# Patient Record
Sex: Female | Born: 1955 | Race: White | Hispanic: No | Smoking: Current every day smoker
Health system: Southern US, Community
[De-identification: ages and names within clinical notes are randomized; demographics above are authoritative.]

## PROBLEM LIST (undated history)

## (undated) DIAGNOSIS — E119 Type 2 diabetes mellitus without complications: Secondary | ICD-10-CM

## (undated) DIAGNOSIS — F410 Panic disorder [episodic paroxysmal anxiety] without agoraphobia: Secondary | ICD-10-CM

## (undated) DIAGNOSIS — Z8619 Personal history of other infectious and parasitic diseases: Secondary | ICD-10-CM

## (undated) DIAGNOSIS — J45909 Unspecified asthma, uncomplicated: Secondary | ICD-10-CM

## (undated) DIAGNOSIS — K219 Gastro-esophageal reflux disease without esophagitis: Secondary | ICD-10-CM

## (undated) DIAGNOSIS — I10 Essential (primary) hypertension: Secondary | ICD-10-CM

## (undated) DIAGNOSIS — E785 Hyperlipidemia, unspecified: Secondary | ICD-10-CM

## (undated) HISTORY — DX: Gastro-esophageal reflux disease without esophagitis: K21.9

## (undated) HISTORY — DX: Unspecified asthma, uncomplicated: J45.909

## (undated) HISTORY — DX: Type 2 diabetes mellitus without complications: E11.9

## (undated) HISTORY — DX: Essential (primary) hypertension: I10

## (undated) HISTORY — DX: Panic disorder (episodic paroxysmal anxiety): F41.0

## (undated) HISTORY — PX: APPENDECTOMY: SHX54

## (undated) HISTORY — DX: Hyperlipidemia, unspecified: E78.5

## (undated) HISTORY — DX: Personal history of other infectious and parasitic diseases: Z86.19

---

## 1988-01-12 HISTORY — PX: CHOLECYSTECTOMY: SHX55

## 1988-01-12 HISTORY — PX: ABDOMINAL HYSTERECTOMY: SHX81

## 2004-02-16 ENCOUNTER — Emergency Department: Payer: Self-pay | Admitting: Emergency Medicine

## 2004-06-05 ENCOUNTER — Emergency Department: Payer: Self-pay | Admitting: Emergency Medicine

## 2005-01-29 ENCOUNTER — Emergency Department: Payer: Self-pay | Admitting: Internal Medicine

## 2005-03-10 ENCOUNTER — Emergency Department: Payer: Self-pay | Admitting: Emergency Medicine

## 2006-05-28 ENCOUNTER — Emergency Department: Payer: Self-pay | Admitting: Emergency Medicine

## 2006-10-21 ENCOUNTER — Emergency Department: Payer: Self-pay | Admitting: Emergency Medicine

## 2007-04-11 DIAGNOSIS — J309 Allergic rhinitis, unspecified: Secondary | ICD-10-CM | POA: Insufficient documentation

## 2008-01-12 HISTORY — PX: COLONOSCOPY: SHX174

## 2008-05-14 ENCOUNTER — Ambulatory Visit: Payer: Self-pay | Admitting: Family Medicine

## 2008-05-24 ENCOUNTER — Emergency Department: Payer: Self-pay | Admitting: Emergency Medicine

## 2008-11-28 ENCOUNTER — Ambulatory Visit: Payer: Self-pay | Admitting: Gastroenterology

## 2008-12-11 DIAGNOSIS — Z860100 Personal history of colon polyps, unspecified: Secondary | ICD-10-CM | POA: Insufficient documentation

## 2008-12-30 ENCOUNTER — Ambulatory Visit: Payer: Self-pay | Admitting: Gastroenterology

## 2009-01-02 ENCOUNTER — Ambulatory Visit: Payer: Self-pay | Admitting: Gastroenterology

## 2009-01-11 HISTORY — PX: OTHER SURGICAL HISTORY: SHX169

## 2009-01-15 DIAGNOSIS — K648 Other hemorrhoids: Secondary | ICD-10-CM | POA: Insufficient documentation

## 2009-03-11 HISTORY — PX: COLECTOMY: SHX59

## 2009-03-20 ENCOUNTER — Ambulatory Visit: Payer: Self-pay | Admitting: Surgery

## 2009-03-20 ENCOUNTER — Ambulatory Visit: Payer: Self-pay | Admitting: Cardiovascular Disease

## 2009-03-31 ENCOUNTER — Inpatient Hospital Stay: Payer: Self-pay | Admitting: Surgery

## 2009-09-09 ENCOUNTER — Ambulatory Visit: Payer: Self-pay | Admitting: Surgery

## 2009-10-07 ENCOUNTER — Ambulatory Visit: Payer: Self-pay | Admitting: Surgery

## 2009-11-25 ENCOUNTER — Ambulatory Visit: Payer: Self-pay | Admitting: Internal Medicine

## 2009-11-25 DIAGNOSIS — I10 Essential (primary) hypertension: Secondary | ICD-10-CM | POA: Insufficient documentation

## 2009-11-25 DIAGNOSIS — F419 Anxiety disorder, unspecified: Secondary | ICD-10-CM

## 2010-02-10 NOTE — Assessment & Plan Note (Signed)
Summary: COUGHING,MUCUS IS GREEN/EVM   Vital Signs:  Patient Profile:   55 Years Old Female CC:      Possible URI Height:     61 inches Weight:      222 pounds BMI:     42.10 O2 Sat:      96 % O2 treatment:    Room Air Temp:     97.5 degrees F tympanic Pulse rate:   89 / minute Pulse rhythm:   regular Resp:     20 per minute BP sitting:   133 / 86  (right arm)  Pt. in pain?   no  Vitals Entered By: Levonne Spiller EMT-P (November 25, 2009 1:50 PM)              Is Patient Diabetic? No Comments Pt is a smoker.1 pack per day. Is wearing a nicotine patch. / rwt      Current Allergies: ! * LATEXHistory of Present Illness Chief Complaint: Possible URI for 1 week History of Present Illness: slowly progressive purulent nasal discharge and sputum worse at night.  today developed "low grade fever" not measured. has had mild chills.  REVIEW OF SYSTEMS Constitutional Symptoms       Complains of fever and chills.     Denies night sweats, weight loss, weight gain, and fatigue.  Eyes       Denies change in vision, eye pain, eye discharge, glasses, contact lenses, and eye surgery. Ear/Nose/Throat/Mouth       Complains of sore throat and hoarseness.      Denies hearing loss/aids, change in hearing, ear pain, ear discharge, dizziness, frequent runny nose, frequent nose bleeds, sinus problems, and tooth pain or bleeding.  Respiratory       Complains of productive cough, wheezing, shortness of breath, and bronchitis.      Denies dry cough, asthma, and emphysema/COPD.      Comments: Colored Sputum. sob only after cough spell Cardiovascular       Denies murmurs, chest pain, and tires easily with exhertion.    Gastrointestinal       Denies stomach pain, nausea/vomiting, diarrhea, constipation, blood in bowel movements, and indigestion.      Comments: Gastrology Surgery 03/2009 Genitourniary       Denies painful urination, kidney stones, and loss of urinary control. Neurological  Denies paralysis, seizures, and fainting/blackouts. Musculoskeletal       Denies muscle pain, joint pain, joint stiffness, decreased range of motion, redness, swelling, muscle weakness, and gout.  Skin       Denies bruising, unusual mles/lumps or sores, and hair/skin or nail changes.  Psych       Denies mood changes, temper/anger issues, anxiety/stress, speech problems, depression, and sleep problems.  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 Status:  current Physical Exam General appearance: obese, well developed, well nourished, no acute distress Eyes: conjunctivae and lids normal Ears: normal, no lesions or deformities Nasal: swollen red turbinates with congestion Oral/Pharynx: tongue normal, posterior pharynx with cobbelstoning Neck: supple,anterior lymphadenopathy present Chest/Lungs: no rales, wheezes, or rhonchi bilateral, breath sounds equal without effort Extremities: normal extremities Neurological: grossly intact and non-focal MSE: oriented to time, place, and person Assessment New Problems: UPPER RESPIRATORY INFECTION, ACUTE (ICD-465.9) HYPERTENSION (ICD-401.9) ANXIETY (ICD-300.00)   Plan New Medications/Changes: AMOXICILLIN-POT CLAVULANATE 875-125 MG TABS (  AMOXICILLIN-POT CLAVULANATE) 1 by mouth two times a day pc  #20 x 0, 11/25/2009, J. Juline Patch MD   The patient and/or caregiver has been counseled thoroughly with regard to medications prescribed including dosage, schedule, interactions, rationale for use, and possible side effects and they verbalize understanding.  Diagnoses and expected course of recovery discussed and will return if not improved as expected or if the condition worsens. Patient and/or caregiver  verbalized understanding.  Prescriptions: AMOXICILLIN-POT CLAVULANATE 875-125 MG TABS (AMOXICILLIN-POT CLAVULANATE) 1 by mouth two times a day pc  #20 x 0   Entered and Authorized by:   J. Juline Patch MD   Signed by:   Shela Commons. Juline Patch MD on 11/25/2009   Method used:   Electronically to        [facility]  [address] (retail)       [address], [address]       [place], [place]  [address]       Ph: [phone]       Fax: [phone]   RxID:   [id number]   Patient Instructions: 1)  continue the nicotine patch. 2)  steam inhaled 3)  Get plenty of rest, drink lots of clear liquids, and use Tylenol or Ibuprofen for fever and comfort. Return in 7-10 days if you're not better:sooner if you're feeling worse. 4)  coricidin for hbp. 5)  stay out of work if fever >101

## 2010-03-18 ENCOUNTER — Encounter: Payer: Self-pay | Admitting: Internal Medicine

## 2010-03-18 ENCOUNTER — Ambulatory Visit: Payer: BC Managed Care – PPO | Admitting: Internal Medicine

## 2010-03-18 DIAGNOSIS — K432 Incisional hernia without obstruction or gangrene: Secondary | ICD-10-CM

## 2010-03-24 NOTE — Assessment & Plan Note (Signed)
Summary: STOMACH PAINS   Vital Signs:  Patient Profile:   55 Years Old Female CC:      abdominal hernia Pain x 10 mos  Pt. in pain?   yes    Location:   abdomen    Intensity:   8    Type:       dull                   Current Allergies: ! * LATEXHistory of Present Illness Chief Complaint: abdominal hernia Pain x 10 mos History of Present Illness: Abd surg 3/11. First had pain and swelling in incision area 2 mos later. Has been told to monitor it. Pain and swelling come and goes and is worse with lifting. For the last few days it has been worse and more ofter. Some times 10/10 and presently 8/10. It gets better to lay down and rest. Some occas nausea but none now. BM's unchanged.  REVIEW OF SYSTEMS Constitutional Symptoms      Denies fever, chills, night sweats, weight loss, weight gain, and fatigue.  Eyes       Denies change in vision, eye pain, eye discharge, glasses, contact lenses, and eye surgery. Ear/Nose/Throat/Mouth       Denies hearing loss/aids, change in hearing, ear pain, ear discharge, dizziness, frequent runny nose, frequent nose bleeds, sinus problems, sore throat, hoarseness, and tooth pain or bleeding.  Respiratory       Denies dry cough, productive cough, wheezing, shortness of breath, asthma, bronchitis, and emphysema/COPD.  Cardiovascular       Denies murmurs, chest pain, and tires easily with exhertion.    Gastrointestinal       Complains of stomach pain.      Denies nausea/vomiting, diarrhea, constipation, blood in bowel movements, and indigestion. Genitourniary       Denies painful urination, kidney stones, and loss of urinary control. Neurological       Denies paralysis, seizures, and fainting/blackouts. Musculoskeletal       Denies muscle pain, joint pain, joint stiffness, decreased range of motion, redness, swelling, muscle weakness, and gout.  Skin       Denies bruising, unusual mles/lumps or sores, and hair/skin or nail changes.  Psych  Denies mood changes, temper/anger issues, anxiety/stress, speech problems, depression, and sleep problems.  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 Swelling and tenderness in RLQ from umbilical surg scar and extending approx15cm. Any trapped intestine was soft and easily reduced and there was much less tenderness when supine Neurological: grossly intact and non-focal Skin: no obvious rashe MSE: oriented to time, place, and person Assessment New Problems: INCI HERNIA WITHOUT MENTION OBSTRUCTION/GANGRENE (ZOX-096.04)   The patient and/or caregiver has been counseled thoroughly with regard to medications prescribed including dosage, schedule, interactions, rationale for use, and possible side effects and they verbalize understanding.  Diagnoses and expected course of recovery discussed and will return if not improved as expected or if the condition worsens. Patient and/or caregiver verbalized understanding.    Patient Instructions:  1)  careful lifting trying to inhale during the lift. 2)  Please schedule a follow-up appointment as needed. Dr. Colette Ribas, general surg, for repair when convenient. 3)  Take 650-1000mg  of Tylenol every 4-6 hours as needed for relief of pain or comfort of fever AVOID taking more than 4000mg   in a 24 hour period (can cause liver damage in higher  doses).  Appended Document: STOMACH PAINS blood pressure 140/76 temp 98.2

## 2010-03-30 ENCOUNTER — Encounter: Payer: Self-pay | Admitting: Internal Medicine

## 2010-04-09 NOTE — Miscellaneous (Signed)
Summary: history form   history form   Imported By: Eugenio Hoes 03/30/2010 13:45:47  _____________________________________________________________________  External Attachment:    Type:   Image     Comment:   External Document

## 2010-04-09 NOTE — Letter (Signed)
Summary: histoy form   histoy form   Imported By: Eugenio Hoes 03/31/2010 15:33:24  _____________________________________________________________________  External Attachment:    Type:   Image     Comment:   External Document

## 2010-04-10 ENCOUNTER — Ambulatory Visit: Payer: Self-pay | Admitting: Surgery

## 2010-04-11 LAB — CA 125: CA 125: 13.4 U/mL (ref 0.0–34.0)

## 2010-04-22 ENCOUNTER — Ambulatory Visit: Payer: Self-pay | Admitting: Surgery

## 2010-08-06 ENCOUNTER — Emergency Department: Payer: Self-pay | Admitting: Unknown Physician Specialty

## 2010-09-10 ENCOUNTER — Ambulatory Visit: Payer: Self-pay | Admitting: Family Medicine

## 2010-09-12 ENCOUNTER — Ambulatory Visit: Payer: Self-pay | Admitting: Family Medicine

## 2010-09-12 HISTORY — PX: HERNIA REPAIR: SHX51

## 2014-05-20 LAB — HEMOGLOBIN A1C: HEMOGLOBIN A1C: 7.5 % — AB (ref 4.0–6.0)

## 2014-09-23 ENCOUNTER — Encounter: Payer: Self-pay | Admitting: Family Medicine

## 2014-09-23 ENCOUNTER — Ambulatory Visit (INDEPENDENT_AMBULATORY_CARE_PROVIDER_SITE_OTHER): Payer: Self-pay | Admitting: Family Medicine

## 2014-09-23 VITALS — BP 120/68 | HR 81 | Temp 98.6°F | Resp 16 | Wt 226.0 lb

## 2014-09-23 DIAGNOSIS — Z8541 Personal history of malignant neoplasm of cervix uteri: Secondary | ICD-10-CM | POA: Insufficient documentation

## 2014-09-23 DIAGNOSIS — M25561 Pain in right knee: Secondary | ICD-10-CM | POA: Insufficient documentation

## 2014-09-23 DIAGNOSIS — E1121 Type 2 diabetes mellitus with diabetic nephropathy: Secondary | ICD-10-CM | POA: Insufficient documentation

## 2014-09-23 DIAGNOSIS — R32 Unspecified urinary incontinence: Secondary | ICD-10-CM | POA: Insufficient documentation

## 2014-09-23 DIAGNOSIS — R7303 Prediabetes: Secondary | ICD-10-CM | POA: Insufficient documentation

## 2014-09-23 DIAGNOSIS — M778 Other enthesopathies, not elsewhere classified: Secondary | ICD-10-CM

## 2014-09-23 DIAGNOSIS — K219 Gastro-esophageal reflux disease without esophagitis: Secondary | ICD-10-CM | POA: Insufficient documentation

## 2014-09-23 DIAGNOSIS — R319 Hematuria, unspecified: Secondary | ICD-10-CM | POA: Insufficient documentation

## 2014-09-23 DIAGNOSIS — G44209 Tension-type headache, unspecified, not intractable: Secondary | ICD-10-CM | POA: Insufficient documentation

## 2014-09-23 DIAGNOSIS — K432 Incisional hernia without obstruction or gangrene: Secondary | ICD-10-CM | POA: Insufficient documentation

## 2014-09-23 DIAGNOSIS — Z8711 Personal history of peptic ulcer disease: Secondary | ICD-10-CM | POA: Insufficient documentation

## 2014-09-23 DIAGNOSIS — M779 Enthesopathy, unspecified: Secondary | ICD-10-CM

## 2014-09-23 DIAGNOSIS — G4700 Insomnia, unspecified: Secondary | ICD-10-CM

## 2014-09-23 DIAGNOSIS — Z8719 Personal history of other diseases of the digestive system: Secondary | ICD-10-CM | POA: Insufficient documentation

## 2014-09-23 DIAGNOSIS — N951 Menopausal and female climacteric states: Secondary | ICD-10-CM | POA: Insufficient documentation

## 2014-09-23 DIAGNOSIS — F419 Anxiety disorder, unspecified: Secondary | ICD-10-CM

## 2014-09-23 DIAGNOSIS — R809 Proteinuria, unspecified: Secondary | ICD-10-CM | POA: Insufficient documentation

## 2014-09-23 DIAGNOSIS — M6588 Other synovitis and tenosynovitis, other site: Secondary | ICD-10-CM

## 2014-09-23 MED ORDER — NAPROXEN 500 MG PO TABS: 500.0000 mg | ORAL_TABLET | Freq: Two times a day (BID) | ORAL | Status: DC

## 2014-09-23 MED ORDER — ALPRAZOLAM 1 MG PO TABS: ORAL_TABLET | ORAL | Status: DC

## 2014-09-23 NOTE — Progress Notes (Signed)
Patient: Tammy Holt Female    DOB: 09-15-1955   59 y.o.   MRN: [id number] Visit Date: 09/23/2014  Today's Provider: Mila Merry, MD   Chief Complaint  Patient presents with  . Hand Pain    Right thumb   Subjective:    Hand Pain  The incident occurred more than 1 week ago (2 weeks ago). The injury mechanism is unknown. The pain is present in the right fingers (right thumb). The quality of the pain is described as aching. The pain radiates to the right hand. The pain is at a severity of 8/10. The pain is moderate. The pain has been constant since the incident. Pertinent negatives include no chest pain, muscle weakness, numbness or tingling. The symptoms are aggravated by palpation, lifting and movement. She has tried NSAIDs for the symptoms. The treatment provided no relief.   The patient said that she just lost her boyfriend of 24 years 2 months ago. Patient said that she is having a hard time dealing with grief. Patient said that she has been having trouble sleeping. She feels really anxious and has trouble composing herself during the day. She is still taking paroxetine every day.    Allergies  Allergen Reactions  . Latex     REACTION: Hives, Blisters   Previous Medications   ALBUTEROL (PROAIR HFA) 108 (90 BASE) MCG/ACT INHALER    Inhale 1-2 puffs into the lungs 4 (four) times daily as needed. For wheezing   BUDESONIDE-FORMOTEROL (SYMBICORT) 80-4.5 MCG/ACT INHALER    Inhale 2 puffs into the lungs 2 (two) times daily as needed.   LISINOPRIL (PRINIVIL,ZESTRIL) 20 MG TABLET    Take 1 tablet by mouth daily.   METFORMIN (GLUCOPHAGE) 850 MG TABLET    Take 1 tablet by mouth daily.   PAROXETINE (PAXIL) 40 MG TABLET    Take 1 tablet by mouth daily.    Review of Systems  Constitutional: Positive for fatigue.  Cardiovascular: Negative for chest pain and palpitations.  Neurological: Negative for tingling and numbness.  Psychiatric/Behavioral: Positive for sleep disturbance and  agitation.    Social History  Substance Use Topics  . Smoking status: Current Every Day Smoker -- 0.50 packs/day for 25 years    Types: Cigarettes  . Smokeless tobacco: Not on file  . Alcohol Use: No   Objective:   BP 120/68 mmHg  Pulse 81  Temp(Src) 98.6 F (37 C) (Oral)  Resp 16  Wt 226 lb (102.513 kg)  SpO2 96%  Physical Exam  General appearance: alert, well developed, well nourished, cooperative and in no distress Head: Normocephalic, without obvious abnormality, atraumatic Lungs: Respirations even and unlabored Extremities: No gross deformities. Left thumb tender dorsal aspect, minimal swelling. Catches when she tried to extend thumb.  Skin: Skin color, texture, turgor normal. No rashes seen  Psych: Appropriate mood and affect. Tearful at times.  Neurologic: Mental status: Alert, oriented to person, place, and time, thought content appropriate.     Assessment & Plan:     1. Insomnia  - ALPRAZolam (XANAX) 1 MG tablet; 1/2 to 1 tablet three times a day as needed  Dispense: 30 tablet; Refill: 3  2. Acute anxiety Continue paroxetine. Consider adding bupropion for depression is not improving over the next few weeks.  - ALPRAZolam (XANAX) 1 MG tablet; 1/2 to 1 tablet three times a day as needed  Dispense: 30 tablet; Refill: 3  3. Thumb tendonitis Advised she should see hand specialist if not  improving on naproxen.  - naproxen (NAPROSYN) 500 MG tablet; Take 1 tablet (500 mg total) by mouth 2 (two) times daily with a meal.  Dispense: 30 tablet; Refill: 1       Mila Merry, MD  [facility]

## 2014-10-10 ENCOUNTER — Other Ambulatory Visit: Payer: Self-pay | Admitting: Family Medicine

## 2014-11-25 ENCOUNTER — Ambulatory Visit (INDEPENDENT_AMBULATORY_CARE_PROVIDER_SITE_OTHER): Payer: Self-pay | Admitting: Family Medicine

## 2014-11-25 ENCOUNTER — Encounter: Payer: Self-pay | Admitting: Family Medicine

## 2014-11-25 VITALS — BP 140/72 | HR 74 | Temp 98.5°F | Resp 18 | Wt 223.0 lb

## 2014-11-25 DIAGNOSIS — R509 Fever, unspecified: Secondary | ICD-10-CM

## 2014-11-25 DIAGNOSIS — R059 Cough, unspecified: Secondary | ICD-10-CM

## 2014-11-25 DIAGNOSIS — R05 Cough: Secondary | ICD-10-CM

## 2014-11-25 LAB — POC INFLUENZA A&B (BINAX/QUICKVUE)
INFLUENZA A, POC: NEGATIVE
INFLUENZA B, POC: NEGATIVE

## 2014-11-25 MED ORDER — HYDROCODONE-HOMATROPINE 5-1.5 MG/5ML PO SYRP: 5.0000 mL | ORAL_SOLUTION | Freq: Three times a day (TID) | ORAL | Status: DC | PRN

## 2014-11-25 MED ORDER — AMOXICILLIN 875 MG PO TABS: 875.0000 mg | ORAL_TABLET | Freq: Two times a day (BID) | ORAL | Status: DC

## 2014-11-25 NOTE — Progress Notes (Signed)
Patient ID: Tammy Holt, female   DOB: 1955-06-21, 59 y.o.   MRN: [id number] Name: Tammy Holt   MRN: [id number]    DOB: January 27, 1955   Date:11/25/2014       Progress Note  Subjective  Chief Complaint  Patient presents with  . Cough  . Nasal Congestion   Cough This is a new problem. The current episode started yesterday. The problem has been gradually worsening. The problem occurs constantly (worse at night). The cough is productive of sputum. Associated symptoms include ear congestion, a fever, nasal congestion, postnasal drip, rhinorrhea and a sore throat. Pertinent negatives include no ear pain. Associated symptoms comments: Temperature up to 100 F. Some sneezing with clear rhinorrhea.. The symptoms are aggravated by pollens (occasional ). She has tried OTC cough suppressant and a beta-agonist inhaler for the symptoms. The treatment provided mild relief.   Past Surgical History  Procedure Laterality Date  . Hernia repair  09/2010    Incisional hernia repair  . Abdominal hysterectomy  1990    Vaginal. Partial. Due to cervical cancer. One ovary resected + right cyst  . Cholecystectomy  1990  . Colectomy  03/2009    partial right colectomy for multiple polyps- all beninign (incidental appendectomy)  . Appendectomy    . Abdominal ultrasound  2011    large incisional hernia and ovarian cyst  . Colonoscopy  2010    multiple colon polyps with single large polyp, internal hemorrhoids. Dr. Niel Hummer. Referral to Genernal surgery for resection   Family History  Problem Relation Age of Onset  . Diabetes Mother   . Stroke Mother   . Depression Mother   . Alcohol abuse Father   . Cirrhosis Father     of the liver  . Colon cancer Sister   . Anxiety disorder Sister   . Lupus Sister   . COPD Sister    Past Medical History  Diagnosis Date  . GERD (gastroesophageal reflux disease)   . Diabetes mellitus without complication (HCC)   . Panic disorder   . Hypertension   . Hyperlipidemia    . Asthma   . History of chicken pox    Social History  Substance Use Topics  . Smoking status: Current Every Day Smoker -- 0.50 packs/day for 25 years    Types: Cigarettes  . Smokeless tobacco: Not on file  . Alcohol Use: No    Current outpatient prescriptions:  .  albuterol (PROAIR HFA) 108 (90 BASE) MCG/ACT inhaler, Inhale 1-2 puffs into the lungs 4 (four) times daily as needed. For wheezing, Disp: , Rfl:  .  ALPRAZolam (XANAX) 1 MG tablet, 1/2 to 1 tablet three times a day as needed, Disp: 30 tablet, Rfl: 3 .  budesonide-formoterol (SYMBICORT) 80-4.5 MCG/ACT inhaler, Inhale 2 puffs into the lungs 2 (two) times daily as needed., Disp: , Rfl:  .  lisinopril (PRINIVIL,ZESTRIL) 20 MG tablet, Take 1 tablet by mouth daily., Disp: , Rfl:  .  metFORMIN (GLUCOPHAGE) 850 MG tablet, TAKE ONE TABLET BY MOUTH ONCE DAILY, Disp: 30 tablet, Rfl: 12 .  naproxen (NAPROSYN) 500 MG tablet, Take 1 tablet (500 mg total) by mouth 2 (two) times daily with a meal., Disp: 30 tablet, Rfl: 1 .  PARoxetine (PAXIL) 40 MG tablet, Take 1 tablet by mouth daily., Disp: , Rfl:   Allergies  Allergen Reactions  . Latex     REACTION: Hives, Blisters   Review of Systems  Constitutional: Positive for fever.  HENT: Positive for congestion,  postnasal drip, rhinorrhea and sore throat. Negative for ear pain.   Eyes: Negative.   Respiratory: Positive for cough and sputum production.   Cardiovascular: Negative.   Gastrointestinal: Negative.   Genitourinary: Negative.   Musculoskeletal: Negative.   Skin: Negative.   Neurological: Negative.   Endo/Heme/Allergies: Negative.   Psychiatric/Behavioral: Negative.    Objective  Filed Vitals:   11/25/14 1429  BP: 140/72  Pulse: 74  Temp: 98.5 F (36.9 C)  TempSrc: Oral  Resp: 18  Weight: 223 lb (101.152 kg)  SpO2: 96%   Physical Exam  Constitutional: She is oriented to person, place, and time and well-developed, well-nourished, and in no distress.  HENT:  Head:  Normocephalic and atraumatic.  Left Ear: External ear normal.  Good transillumination throughout sinuses. Slightly cobblestoning of posterior pharynx.  Eyes: Conjunctivae and EOM are normal.  Neck: Normal range of motion. Neck supple.  Pulmonary/Chest: Effort normal. She has no wheezes. She has no rales.  Coarse breath sounds.   Abdominal: Soft. Bowel sounds are normal.  Neurological: She is alert and oriented to person, place, and time.  Skin: No rash noted.  Psychiatric: Affect and judgment normal.    Assessment & Plan  1. Fever, unspecified fever cause Onset yesterday with cough and congestion. History of smoking for 26 years. Flu test negative for A&B influenza. Increase fluid intake and check CBS with diff. Given antibiotic with cough medication. Recheck prn pending lab reports. - POC Influenza A&B (Binax test) - CBC with Differential/Platelet - amoxicillin (AMOXIL) 875 MG tablet; Take 1 tablet (875 mg total) by mouth 2 (two) times daily.  Dispense: 20 tablet; Refill: 0  2. Cough Very ticklish and some yellowish sputum. Will give cough suppressant and may add expectorant with Tylenol or Advil. Recheck prn. - POC Influenza A&B (Binax test) - HYDROcodone-homatropine (HYCODAN) 5-1.5 MG/5ML syrup; Take 5 mLs by mouth every 8 (eight) hours as needed for cough.  Dispense: 120 mL; Refill: 0

## 2014-11-26 ENCOUNTER — Telehealth: Payer: Self-pay

## 2014-11-26 LAB — CBC WITH DIFFERENTIAL/PLATELET
BASOS ABS: 0 10*3/uL (ref 0.0–0.2)
Basos: 0 %
EOS (ABSOLUTE): 0.1 10*3/uL (ref 0.0–0.4)
Eos: 1 %
HEMOGLOBIN: 15.2 g/dL (ref 11.1–15.9)
Hematocrit: 43.4 % (ref 34.0–46.6)
Immature Grans (Abs): 0 10*3/uL (ref 0.0–0.1)
Immature Granulocytes: 0 %
LYMPHS ABS: 3.7 10*3/uL — AB (ref 0.7–3.1)
Lymphs: 26 %
MCH: 31.3 pg (ref 26.6–33.0)
MCHC: 35 g/dL (ref 31.5–35.7)
MCV: 89 fL (ref 79–97)
MONOCYTES: 8 %
MONOS ABS: 1.1 10*3/uL — AB (ref 0.1–0.9)
NEUTROS ABS: 9.1 10*3/uL — AB (ref 1.4–7.0)
Neutrophils: 65 %
Platelets: 348 10*3/uL (ref 150–379)
RBC: 4.86 x10E6/uL (ref 3.77–5.28)
RDW: 13.2 % (ref 12.3–15.4)
WBC: 14.1 10*3/uL — AB (ref 3.4–10.8)

## 2014-11-26 NOTE — Telephone Encounter (Signed)
LMTCB

## 2014-11-26 NOTE — Telephone Encounter (Signed)
Pt is returning call.  UJ#[phone]/FACB#[phone]/MW

## 2014-11-26 NOTE — Telephone Encounter (Signed)
-----   Message from Tamsen Roersennis E Chrismon, [place] sent at 11/26/2014  8:19 AM EST ----- Elevation of WBC and high neutrophils indicates some infection present. Proceed with antibiotic. Should consider chest x-ray if no better in 3-4 days of the antibiotic. Recheck blood level and cough in 2 weeks.

## 2014-11-27 NOTE — Telephone Encounter (Signed)
Patient advised as directed below. Patient verbalized understanding and agrees with plan of care. Patient states she will call back to schedule a follow up appointment.  

## 2014-12-10 ENCOUNTER — Ambulatory Visit: Payer: Self-pay | Admitting: Family Medicine

## 2014-12-13 ENCOUNTER — Encounter: Payer: Self-pay | Admitting: Family Medicine

## 2014-12-13 ENCOUNTER — Ambulatory Visit (INDEPENDENT_AMBULATORY_CARE_PROVIDER_SITE_OTHER): Payer: Self-pay | Admitting: Family Medicine

## 2014-12-13 VITALS — BP 144/82 | HR 88 | Temp 98.2°F | Resp 18 | Wt 221.4 lb

## 2014-12-13 DIAGNOSIS — J40 Bronchitis, not specified as acute or chronic: Secondary | ICD-10-CM

## 2014-12-13 DIAGNOSIS — I10 Essential (primary) hypertension: Secondary | ICD-10-CM

## 2014-12-13 DIAGNOSIS — D72829 Elevated white blood cell count, unspecified: Secondary | ICD-10-CM

## 2014-12-13 NOTE — Progress Notes (Signed)
Patient ID: Tammy Holt, female   DOB: 1955-12-25, 59 y.o.   MRN: [id number]    Chief Complaint  Patient presents with  . Abnormal Lab  . Follow-up    Subjective:  HPI This 59 year old female had elevation of WBC count to 14,100 on 11-25-14 with fever and cough. Was treated with antibiotic and cough syrup. No return of fever and congestion has cleared.  Prior to Admission medications   Medication Sig Start Date End Date Taking? Authorizing Provider  albuterol (PROAIR HFA) 108 (90 BASE) MCG/ACT inhaler Inhale 1-2 puffs into the lungs 4 (four) times daily as needed. For wheezing 01/18/11  Yes Historical Provider, MD  ALPRAZolam Prudy Feeler(XANAX) 1 MG tablet 1/2 to 1 tablet three times a day as needed 09/23/14  Yes Malva Limesonald E Fisher, MD  budesonide-formoterol [facility](SYMBICORT) 80-4.5 MCG/ACT inhaler Inhale 2 puffs into the lungs 2 (two) times daily as needed. 09/24/13  Yes Historical Provider, MD  lisinopril (PRINIVIL,ZESTRIL) 20 MG tablet Take 1 tablet by mouth daily. 05/20/14  Yes Historical Provider, MD  metFORMIN (GLUCOPHAGE) 850 MG tablet TAKE ONE TABLET BY MOUTH ONCE DAILY 10/10/14  Yes Malva Limesonald E Fisher, MD  naproxen (NAPROSYN) 500 MG tablet Take 1 tablet (500 mg total) by mouth 2 (two) times daily with a meal. 09/23/14  Yes Malva Limesonald E Fisher, MD  PARoxetine (PAXIL) 40 MG tablet Take 1 tablet by mouth daily. 05/20/14  Yes Historical Provider, MD  HYDROcodone-homatropine (HYCODAN) 5-1.5 MG/5ML syrup Take 5 mLs by mouth every 8 (eight) hours as needed for cough. Patient not taking: Reported on 12/13/2014 11/25/14   Jodell Ciproennis E Chrismon, PA    Patient Active Problem List   Diagnosis Date Noted  . 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 GERD (gastroesophageal reflux disease) 09/23/2014  . Hematuria 09/23/2014  . Incisional hernia 09/23/2014  . History of cervical cancer 09/23/2014  . History of gastric ulcer 09/23/2014  . Menopausal symptoms 09/23/2014  . Proteinuria 09/23/2014  . Right knee pain  09/23/2014  . Tension type headache 09/23/2014  . Urinary incontinence 09/23/2014  . Insomnia 09/23/2014  . INCI HERNIA WITHOUT MENTION OBSTRUCTION/GANGRENE 03/18/2010  . Acute anxiety 11/25/2009  . HYPERTENSION 11/25/2009  . Cyst of ovary 02/09/2009  . Internal hemorrhoids without complication 01/15/2009  . History of colon polyps 12/11/2008  . External hemorrhoids without complication 10/29/2008  . Asthma without status asthmaticus 05/14/2008  . Allergic rhinitis 04/11/2007  . Current tobacco use 04/11/2007  . Panic disorder 04/11/2007  . Pure hypercholesterolemia 04/11/2007  . Overflow incontinence 04/11/2007  . Benign essential HTN 04/11/2007    Past Medical History  Diagnosis Date  . GERD (gastroesophageal reflux disease)   . Diabetes mellitus without complication (HCC)   . Panic disorder   . Hypertension   . Hyperlipidemia   . Asthma   . History of chicken pox     Social History   Social History  . Marital Status: Divorced    Spouse Name: N/A  . Number of Children: 3  . Years of Education: N/A   Occupational History  . Unemployed     looking for work. Previously employed at [employer] in [employer] for 10 years. Was fired due to having surgery and patient no longer able to do assigned tasks   Social History Main Topics  . Smoking status: Current Every Day Smoker -- 0.50 packs/day for 25 years    Types: Cigarettes  . Smokeless tobacco: Not on file  . Alcohol Use: No  .  Drug Use: No  . Sexual Activity: Not on file   Other Topics Concern  . Not on file   Social History Narrative    Allergies  Allergen Reactions  . Latex     REACTION: Hives, Blisters    Review of Systems  Constitutional: Negative.   HENT: Negative.   Eyes: Negative.   Respiratory: Positive for cough.   Cardiovascular: Negative.   Gastrointestinal: Negative.   Genitourinary: Negative.   Musculoskeletal: Negative.   Skin: Negative.   Neurological: Negative.     Endo/Heme/Allergies: Negative.   Psychiatric/Behavioral: Negative.     Immunization History  Administered Date(s) Administered  . Pneumococcal Polysaccharide-23 05/19/2011  . Tdap 05/19/2011   Objective:  BP 144/82 mmHg  Pulse 88  Temp(Src) 98.2 F (36.8 C) (Oral)  Resp 18  Wt 221 lb 6.4 oz (100.426 kg)  SpO2 96%  Physical Exam  Constitutional: She is oriented to person, place, and time and well-developed, well-nourished, and in no distress.  HENT:  Head: Normocephalic and atraumatic.  Right Ear: External ear normal.  Left Ear: External ear normal.  Nose: Nose normal.  Mouth/Throat: Oropharynx is clear and moist.  Eyes: Conjunctivae and EOM are normal.  Neck: Normal range of motion. Neck supple.  Cardiovascular: Normal rate, regular rhythm and normal heart sounds.   Pulmonary/Chest: Effort normal and breath sounds normal.  Abdominal: Soft. Bowel sounds are normal.  Lymphadenopathy:    She has no cervical adenopathy.  Neurological: She is alert and oriented to person, place, and time.    Lab Results  Component Value Date   WBC 14.1* 11/25/2014   HCT 43.4 11/25/2014   HGBA1C 7.5* 05/20/2014     Assessment and Plan :  1. Bronchitis Chest clear and no further fever. Still smoking up to 1 ppd. States she wants to work on quitting again after the Christmas Holiday. Still using Symbicort and occasionally some of the left over Hycodan for any cough. Recheck prn.  2. Leukocytosis WBC count was elevated with fever and cough 2 weeks ago. Will recheck CBC to be sure WBC count went back to normal. Recheck pending reports. Encouraged to continue routine follow up with Dr. Sherrie Mustache regarding diabetes follow up. - CBC with Differential/Platelet   Dortha Kern PA [facility] 12/13/2014 9:25 AM

## 2014-12-14 LAB — CBC WITH DIFFERENTIAL/PLATELET
Basophils Absolute: 0 10*3/uL (ref 0.0–0.2)
Basos: 0 %
EOS (ABSOLUTE): 0.1 10*3/uL (ref 0.0–0.4)
EOS: 1 %
Hematocrit: 43.9 % (ref 34.0–46.6)
Hemoglobin: 15.3 g/dL (ref 11.1–15.9)
Immature Grans (Abs): 0 10*3/uL (ref 0.0–0.1)
Immature Granulocytes: 0 %
Lymphocytes Absolute: 4.4 10*3/uL — ABNORMAL HIGH (ref 0.7–3.1)
Lymphs: 34 %
MCH: 31.2 pg (ref 26.6–33.0)
MCHC: 34.9 g/dL (ref 31.5–35.7)
MCV: 89 fL (ref 79–97)
MONOS ABS: 0.8 10*3/uL (ref 0.1–0.9)
Monocytes: 6 %
Neutrophils Absolute: 7.7 10*3/uL — ABNORMAL HIGH (ref 1.4–7.0)
Neutrophils: 59 %
PLATELETS: 386 10*3/uL — AB (ref 150–379)
RBC: 4.91 x10E6/uL (ref 3.77–5.28)
RDW: 12.9 % (ref 12.3–15.4)
WBC: 13 10*3/uL — ABNORMAL HIGH (ref 3.4–10.8)

## 2014-12-16 ENCOUNTER — Telehealth: Payer: Self-pay | Admitting: Family Medicine

## 2014-12-16 ENCOUNTER — Telehealth: Payer: Self-pay

## 2014-12-16 NOTE — Telephone Encounter (Signed)
Pt stated that she was returning a call about her results. Pt stated she didn't get the message that was left. Thanks TNP

## 2014-12-16 NOTE — Telephone Encounter (Signed)
-----   Message from Kohl'sDennis E Chrismon, [place]PA sent at 12/16/2014  8:20 AM EST ----- WBC count improved. Recheck if any problems with congestion in the near future.

## 2014-12-16 NOTE — Telephone Encounter (Signed)
Left patient a voicemail advising her of the message below. (Consent in chart)

## 2014-12-17 NOTE — Telephone Encounter (Signed)
Advised patient of lab results in previous message on 12/16/2014.

## 2015-01-25 ENCOUNTER — Other Ambulatory Visit: Payer: Self-pay | Admitting: Family Medicine

## 2015-01-27 ENCOUNTER — Other Ambulatory Visit: Payer: Self-pay | Admitting: Family Medicine

## 2015-01-27 MED ORDER — PAROXETINE HCL 40 MG PO TABS: 40.0000 mg | ORAL_TABLET | Freq: Every day | ORAL | Status: DC

## 2015-01-27 NOTE — Telephone Encounter (Signed)
Pt needs refill  lisinopril (PRINIVIL,ZESTRIL) 20 MG tablet 01/26/15 -- Malva Limesonald E Fisher, MD TAKE ONE TABLET BY MOUTH ONCE DAILY PARoxetine (PAXIL) 40 MG tablet   [facility] [address]  [place]

## 2015-01-28 NOTE — Telephone Encounter (Signed)
Left detailed message on pt's vm notifying her that she is due for f/u appt.

## 2015-03-03 ENCOUNTER — Ambulatory Visit (INDEPENDENT_AMBULATORY_CARE_PROVIDER_SITE_OTHER): Payer: Self-pay | Admitting: Family Medicine

## 2015-03-03 ENCOUNTER — Encounter: Payer: Self-pay | Admitting: Family Medicine

## 2015-03-03 VITALS — BP 126/64 | HR 71 | Temp 97.3°F | Resp 16 | Wt 216.0 lb

## 2015-03-03 DIAGNOSIS — G4700 Insomnia, unspecified: Secondary | ICD-10-CM

## 2015-03-03 DIAGNOSIS — E1140 Type 2 diabetes mellitus with diabetic neuropathy, unspecified: Secondary | ICD-10-CM

## 2015-03-03 DIAGNOSIS — I10 Essential (primary) hypertension: Secondary | ICD-10-CM

## 2015-03-03 DIAGNOSIS — Z720 Tobacco use: Secondary | ICD-10-CM

## 2015-03-03 DIAGNOSIS — F419 Anxiety disorder, unspecified: Secondary | ICD-10-CM

## 2015-03-03 LAB — POCT GLYCOSYLATED HEMOGLOBIN (HGB A1C)
ESTIMATED AVERAGE GLUCOSE: 126
Hemoglobin A1C: 6

## 2015-03-03 MED ORDER — LISINOPRIL 20 MG PO TABS: 20.0000 mg | ORAL_TABLET | Freq: Every day | ORAL | Status: DC

## 2015-03-03 MED ORDER — PAROXETINE HCL 40 MG PO TABS: 40.0000 mg | ORAL_TABLET | Freq: Every day | ORAL | Status: DC

## 2015-03-03 MED ORDER — ALPRAZOLAM 1 MG PO TABS: ORAL_TABLET | ORAL | Status: DC

## 2015-03-03 NOTE — Progress Notes (Signed)
Patient: Tammy Holt Female    DOB: 05/28/1955   60 y.o.   MRN: [id number] Visit Date: 03/03/2015  Today's Provider: Mila Merry, MD   Chief Complaint  Patient presents with  . Hypertension    follow up  . Diabetes    follow up  . Anxiety    follow up   Subjective:    HPI  Diabetes Mellitus Type II, Follow-up:   Lab Results  Component Value Date   HGBA1C 7.5* 05/20/2014    Last seen for diabetes 9 months ago.  Management since then includes starting Metformin  daily. She reports good compliance with treatment. She is not having side effects.  Current symptoms include none and have been stable. Home blood sugar records: 180-200 in the evenings  Episodes of hypoglycemia? no   Current Insulin Regimen: none Most Recent Eye Exam: >1 year Weight trend: stable Prior visit with dietician: no Current diet: in general, a "healthy" diet   Current exercise: walking  Pertinent Labs: No results found for: CHOL, TRIG, HDL, LDLCALC, CREATININE  Wt Readings from Last 3 Encounters:  12/13/14 221 lb 6.4 oz (100.426 kg)  11/25/14 223 lb (101.152 kg)  09/23/14 226 lb (102.513 kg)    ------------------------------------------------------------------------   Hypertension, follow-up:  BP Readings from Last 3 Encounters:  12/13/14 144/82  11/25/14 140/72  09/23/14 120/68    She was last seen for hypertension 9 months ago.  BP at that visit was 132/68. Management since that visit includes no changes. She reports good compliance with treatment. She is not having side effects.  She is exercising. She is adherent to low salt diet.   Outside blood pressures are not being check. She is experiencing none.  Patient denies chest pain, chest pressure/discomfort, claudication, dyspnea, exertional chest pressure/discomfort, fatigue, irregular heart beat, lower extremity edema, near-syncope, orthopnea, palpitations, paroxysmal nocturnal dyspnea, syncope and  tachypnea.   Cardiovascular risk factors include diabetes mellitus and hypertension.  Use of agents associated with hypertension: none.     Weight trend: stable Wt Readings from Last 3 Encounters:  12/13/14 221 lb 6.4 oz (100.426 kg)  11/25/14 223 lb (101.152 kg)  09/23/14 226 lb (102.513 kg)    Current diet: in general, a "healthy" diet    ------------------------------------------------------------------------  Follow up Anxiety:  Last office visit was 5 months ago. Changes made include starting Alprazolam. Patient was to continue Paroxetine. Would consider adding Bupropion for Depression if not improving over the next few weeks.  Patient states she stays stressed out because she cant find a job, but feels medications are helping quite a bit and wishes to continue.    Follow up Insomnia:  Last office visit was 5 months ago. Changes made includes starting Alprazolam. Patient reports good compliance with treatment. Patient reports she sleeps 4-6 hours per night.     Allergies  Allergen Reactions  . Latex     REACTION: Hives, Blisters   Previous Medications   ALBUTEROL (PROAIR HFA) 108 (90 BASE) MCG/ACT INHALER    Inhale 1-2 puffs into the lungs 4 (four) times daily as needed. For wheezing   ALPRAZOLAM (XANAX) 1 MG TABLET    1/2 to 1 tablet three times a day as needed   BUDESONIDE-FORMOTEROL (SYMBICORT) 80-4.5 MCG/ACT INHALER    Inhale 2 puffs into the lungs 2 (two) times daily as needed.   LISINOPRIL (PRINIVIL,ZESTRIL) 20 MG TABLET    TAKE ONE TABLET BY MOUTH ONCE DAILY   METFORMIN (GLUCOPHAGE)  850 MG TABLET    TAKE ONE TABLET BY MOUTH ONCE DAILY   NAPROXEN (NAPROSYN) 500 MG TABLET    Take 1 tablet (500 mg total) by mouth 2 (two) times daily with a meal.   PAROXETINE (PAXIL) 40 MG TABLET    Take 1 tablet (40 mg total) by mouth daily.    Review of Systems  Constitutional: Negative for fever, chills, appetite change and fatigue.  Respiratory: Negative for chest tightness and  shortness of breath.   Cardiovascular: Negative for chest pain and palpitations.  Gastrointestinal: Negative for nausea, vomiting and abdominal pain.  Neurological: Negative for dizziness and weakness.  Psychiatric/Behavioral: Positive for sleep disturbance and dysphoric mood. Negative for suicidal ideas. The patient is nervous/anxious.     Social History  Substance Use Topics  . Smoking status: Current Every Day Smoker -- 1.00 packs/day for 25 years    Types: Cigarettes  . Smokeless tobacco: Not on file  . Alcohol Use: 0.0 oz/week    0 Standard drinks or equivalent per week     Comment: occasionally   Objective:   BP 126/64 mmHg  Pulse 71  Temp(Src) 97.3 F (36.3 C) (Oral)  Resp 16  Wt 216 lb (97.977 kg)  SpO2 95%  Physical Exam  General Appearance:    Alert, cooperative, no distress, obese  Eyes:    PERRL, conjunctiva/corneas clear, EOM's intact       Lungs:     Clear to auscultation bilaterally, respirations unlabored  Heart:    Regular rate and rhythm  Neurologic:   Awake, alert, oriented x 3. No apparent focal neurological           defect.       Results for orders placed or performed in visit on 03/03/15  POCT HgB A1C  Result Value Ref Range   Hemoglobin A1C 6.0    Est. average glucose Bld gHb Est-mCnc 126        Assessment & Plan:      1. Type 2 diabetes, controlled, with neuropathy (HCC) Well controlled.  Continue current medications.   - POCT HgB A1C  2. Essential hypertension Well controlled.  Continue current medications.   - Renal function panel - Lipid panel - lisinopril (PRINIVIL,ZESTRIL) 20 MG tablet; Take 1 tablet (20 mg total) by mouth daily.  Dispense: 90 tablet; Refill: 1  3. Current tobacco use Stop smoking   4. Insomnia Does well with prn alprazolam - ALPRAZolam (XANAX) 1 MG tablet; 1/2 to 1 tablet three times a day as needed  Dispense: 30 tablet; Refill: 3  5. Acute anxiety Continue current medications. Continue current medications.    - ALPRAZolam (XANAX) 1 MG tablet; 1/2 to 1 tablet three times a day as needed  Dispense: 30 tablet; Refill: 3 - PARoxetine (PAXIL) 40 MG tablet; Take 1 tablet (40 mg total) by mouth daily.  Dispense: 90 tablet; Refill: 1       Mila Merry, MD  [facility]

## 2015-07-02 ENCOUNTER — Other Ambulatory Visit: Payer: Self-pay | Admitting: Family Medicine

## 2015-07-02 DIAGNOSIS — G4700 Insomnia, unspecified: Secondary | ICD-10-CM

## 2015-07-02 DIAGNOSIS — F419 Anxiety disorder, unspecified: Secondary | ICD-10-CM

## 2015-07-02 NOTE — Telephone Encounter (Signed)
Pt needs refill on ALPRAZolam (XANAX) 1 MG tablet  She knows it is probably time for her to come in but she does not have insurance right now.  She also stated that today is the anniversary date of her husbands death.  Pt's call back is  [phone]  [facility] [address]

## 2015-07-02 NOTE — Telephone Encounter (Signed)
Please call in alprazolam.  

## 2015-07-03 ENCOUNTER — Other Ambulatory Visit: Payer: Self-pay | Admitting: Family Medicine

## 2015-07-03 MED ORDER — ALPRAZOLAM 1 MG PO TABS: ORAL_TABLET | ORAL | Status: DC

## 2015-07-03 NOTE — Telephone Encounter (Signed)
Please call in alprazolam.  

## 2015-07-03 NOTE — Telephone Encounter (Signed)
Rx called in to pharmacy. 

## 2015-09-01 ENCOUNTER — Ambulatory Visit (INDEPENDENT_AMBULATORY_CARE_PROVIDER_SITE_OTHER): Payer: Self-pay | Admitting: Family Medicine

## 2015-09-01 ENCOUNTER — Encounter: Payer: Self-pay | Admitting: Family Medicine

## 2015-09-01 VITALS — BP 122/68 | HR 81 | Temp 98.2°F | Resp 16 | Wt 208.0 lb

## 2015-09-01 DIAGNOSIS — Z720 Tobacco use: Secondary | ICD-10-CM

## 2015-09-01 DIAGNOSIS — F410 Panic disorder [episodic paroxysmal anxiety] without agoraphobia: Secondary | ICD-10-CM

## 2015-09-01 DIAGNOSIS — E1121 Type 2 diabetes mellitus with diabetic nephropathy: Secondary | ICD-10-CM

## 2015-09-01 DIAGNOSIS — M7632 Iliotibial band syndrome, left leg: Secondary | ICD-10-CM

## 2015-09-01 DIAGNOSIS — I10 Essential (primary) hypertension: Secondary | ICD-10-CM

## 2015-09-01 DIAGNOSIS — F419 Anxiety disorder, unspecified: Secondary | ICD-10-CM

## 2015-09-01 DIAGNOSIS — G4700 Insomnia, unspecified: Secondary | ICD-10-CM

## 2015-09-01 LAB — POCT GLYCOSYLATED HEMOGLOBIN (HGB A1C)
Est. average glucose Bld gHb Est-mCnc: 120
HEMOGLOBIN A1C: 5.8

## 2015-09-01 MED ORDER — PAROXETINE HCL 40 MG PO TABS: 40.0000 mg | ORAL_TABLET | Freq: Every day | ORAL | 1 refills | Status: DC

## 2015-09-01 MED ORDER — LISINOPRIL 20 MG PO TABS: 20.0000 mg | ORAL_TABLET | Freq: Every day | ORAL | 1 refills | Status: DC

## 2015-09-01 MED ORDER — METFORMIN HCL 850 MG PO TABS: 850.0000 mg | ORAL_TABLET | Freq: Every day | ORAL | 12 refills | Status: DC

## 2015-09-01 MED ORDER — ZOLPIDEM TARTRATE 5 MG PO TABS: 5.0000 mg | ORAL_TABLET | Freq: Every evening | ORAL | 1 refills | Status: DC | PRN

## 2015-09-01 MED ORDER — NAPROXEN 500 MG PO TABS: 500.0000 mg | ORAL_TABLET | Freq: Two times a day (BID) | ORAL | 1 refills | Status: DC

## 2015-09-01 NOTE — Progress Notes (Signed)
Patient: Tammy Holt Female    DOB: 06/02/1955   60 y.o.   MRN: [id number] Visit Date: 09/01/2015  Today's Provider: Mila Merry, MD   Chief Complaint  Patient presents with  . Diabetes    follow up  . Hypertension    follow up  . Anxiety    follow up  . Insomnia    follow up   Subjective:    HPI  Diabetes Mellitus Type II, Follow-up:   Lab Results  Component Value Date   HGBA1C 6.0 03/03/2015   HGBA1C 7.5 (A) 05/20/2014    Last seen for diabetes 6 months ago.  Management since then includes no changes. She reports poor compliance with treatment. Has been out of Diabetes medication for 3 weeks. She is not having side effects.  Current symptoms include hyperglycemia and have been worsening. Home blood sugar records: 145 last week fasting; 400 this afternoon  Episodes of hypoglycemia? no   Current Insulin Regimen: none Most Recent Eye Exam: >1 year Weight trend: decreasing steadily Prior visit with dietician: no Current diet: in general, an "unhealthy" diet Current exercise: none- patient states she does walk around at work  Pertinent Labs: No results found for: CHOL, TRIG, HDL, LDLCALC, CREATININE  Wt Readings from Last 3 Encounters:  03/03/15 216 lb (98 kg)  12/13/14 221 lb 6.4 oz (100.4 kg)  11/25/14 223 lb (101.2 kg)    ------------------------------------------------------------------------  Hypertension, follow-up:  BP Readings from Last 3 Encounters:  03/03/15 126/64  12/13/14 (!) 144/82  11/25/14 140/72    She was last seen for hypertension 6 months ago.  BP at that visit was 126/64. Management since that visit includes no changes. She reports good compliance with treatment. She is not having side effects.  She is not exercising. She is adherent to low salt diet.   Outside blood pressures are 112/ 70. She is experiencing none.  Patient denies chest pain, chest pressure/discomfort, claudication, dyspnea, exertional chest  pressure/discomfort, fatigue, irregular heart beat, lower extremity edema, near-syncope, orthopnea, palpitations, paroxysmal nocturnal dyspnea, syncope and tachypnea.   Cardiovascular risk factors include diabetes mellitus, hypertension and obesity (BMI >= 30 kg/m2).  Use of agents associated with hypertension: none.     Weight trend: decreasing steadily Wt Readings from Last 3 Encounters:  03/03/15 216 lb (98 kg)  12/13/14 221 lb 6.4 oz (100.4 kg)  11/25/14 223 lb (101.2 kg)   Current diet: in general, an "unhealthy" diet, vegetarian   Follow up Anxiety:  Patient was last seen for this problem 6 months ago and no changes were made. Patient reports good compliance with treatment, good tolerance and poor symptom control. Patient feels that the medication is no longer working as it did in the beginning.     Follow up Insomnia:  Patient was last seen  For this problem 6 months ago and no changes were made. Patient was to continue as needed Xanax.  Patient feels that the medication is no longer working as it did in the beginning.  Patient reports sleeping an average of 5-6 hours per night.    Complains of numbness and tingling left lateral leg from hip to knee for the last month. States leg has given out three times.     Allergies  Allergen Reactions  . Latex     REACTION: Hives, Blisters   Current Meds  Medication Sig  . albuterol (PROAIR HFA) 108 (90 BASE) MCG/ACT inhaler Inhale 1-2 puffs into the lungs  4 (four) times daily as needed. For wheezing  . ALPRAZolam (XANAX) 1 MG tablet 1/2 to 1 tablet three times a day as needed  . budesonide-formoterol (SYMBICORT) 80-4.5 MCG/ACT inhaler Inhale 2 puffs into the lungs 2 (two) times daily as needed.  Marland Kitchen. lisinopril (PRINIVIL,ZESTRIL) 20 MG tablet Take 1 tablet (20 mg total) by mouth daily.  . metFORMIN (GLUCOPHAGE) 850 MG tablet TAKE ONE TABLET BY MOUTH ONCE DAILY  . naproxen (NAPROSYN) 500 MG tablet Take 1 tablet (500 mg total) by mouth 2  (two) times daily with a meal.  . PARoxetine (PAXIL) 40 MG tablet Take 1 tablet (40 mg total) by mouth daily.    Review of Systems  Constitutional: Negative for appetite change, chills, fatigue and fever.  Respiratory: Negative for chest tightness and shortness of breath.   Cardiovascular: Negative for chest pain and palpitations.  Gastrointestinal: Positive for abdominal pain. Negative for nausea and vomiting.  Endocrine: Negative for polydipsia, polyphagia and polyuria.  Musculoskeletal: Positive for myalgias (left leg).  Neurological: Positive for numbness (in left leg). Negative for dizziness and weakness.  Psychiatric/Behavioral: Positive for agitation, dysphoric mood and sleep disturbance. Negative for confusion, self-injury and suicidal ideas. The patient is nervous/anxious.     Social History  Substance Use Topics  . Smoking status: Current Every Day Smoker    Packs/day: 0.75    Years: 25.00    Types: Cigarettes  . Smokeless tobacco: Never Used  . Alcohol use 0.0 oz/week     Comment: occasionally   Objective:   BP 122/68 (BP Location: Right Arm, Patient Position: Sitting, Cuff Size: Large)   Pulse 81   Temp 98.2 F (36.8 C) (Oral)   Resp 16   Wt 208 lb (94.3 kg)   SpO2 97% Comment: room air  BMI 40.29 kg/m   Physical Exam  General Appearance:    Alert, cooperative, no distress, obese  Eyes:    PERRL, conjunctiva/corneas clear, EOM's intact       Lungs:     Clear to auscultation bilaterally, respirations unlabored  Heart:    Regular rate and rhythm  Neurologic:   Awake, alert, oriented x 3. No apparent focal neurological           defect.         Results for orders placed or performed in visit on 09/01/15  POCT HgB A1C  Result Value Ref Range   Hemoglobin A1C 5.8    Est. average glucose Bld gHb Est-mCnc 120        Assessment & Plan:     1. Diabetes mellitus with nephropathy (HCC) Well controlled.  Continue current medications.   - POCT HgB A1C -  metFORMIN (GLUCOPHAGE) 850 MG tablet; Take 1 tablet (850 mg total) by mouth daily.  Dispense: 30 tablet; Refill: 12  2. Current tobacco use Strongly encouraged to stop smoking   3. Iliotibial band syndrome of left side  - naproxen (NAPROSYN) 500 MG tablet; Take 1 tablet (500 mg total) by mouth 2 (two) times daily with a meal.  Dispense: 30 tablet; Refill: 1  4. Essential hypertension Well controlled.  Continue current medications.   - lisinopril (PRINIVIL,ZESTRIL) 20 MG tablet; Take 1 tablet (20 mg total) by mouth daily.  Dispense: 90 tablet; Refill: 1  5. Insomnia  - zolpidem (AMBIEN) 5 MG tablet; Take 1 tablet (5 mg total) by mouth at bedtime as needed for sleep.  Dispense: 15 tablet; Refill: 1  6. Acute anxiety Controlled. Continue current medications.  7. Panic disorder  - PARoxetine (PAXIL) 40 MG tablet; Take 1 tablet (40 mg total) by mouth daily.  Dispense: 90 tablet; Refill: 1     The entirety of the information documented in the History of Present Illness, Review of Systems and Physical Exam were personally obtained by me. Portions of this information were initially documented by Awilda Billoshena Chambers, CMA and reviewed by me for thoroughness and accuracy.   Return in about 6 months (around 03/03/2016).  Mila Merryonald Fisher, MD  [facility]

## 2015-09-01 NOTE — Patient Instructions (Addendum)
   Recommend taking 81mg  enteric coated aspirin to reduce risk of vascular events such as heart attacks and strokes.    Please stop smoking    Iliotibial Band Syndrome Iliotibial band syndrome is pain in the outer, lower thigh. The pain is caused by an inflammation of the iliotibial band. This is a band of thick fibrous tissue that runs down the outside of the thigh. The iliotibial band begins at the hip. It extends to the outer side of the shin bone (tibia) just below the knee joint. The band works with the thigh muscles. Together they provide stability to the outside of the knee joint. Iliotibial band syndrome occurs when there is inflammation to this band of tissue. This is typically due to over use and not due to an injury. The irritation usually occurs over the outside of the knee joint, at the the end of the thigh bone (femur). The iliotibial band crosses bone and muscle at this point. Between these structures is a cushioning sac (bursa). The bursa should make possible a smooth gliding motion. However, when inflamed, the iliotibial band does not glide easily. When inflamed, there is pain with motion of the knee. Usually the pain worsens with continued movement and the pain goes away with rest. This problem usually arises when there is a sudden increase in sports activities involving your legs. Running, and playing soccer or basketball are examples of activities causing this. Others who are prone to iliotibial band syndrome include individuals with mechanical problems such as leg length differences, abnormality of walking, bowed legs etc. HOME CARE INSTRUCTIONS   Apply ice to the injured area:  Put ice in a plastic bag.  Place a towel between your skin and the bag.  Leave the ice on for 20 minutes, 2-3 times a day.  Limit excessive training or eliminate training until pain goes away.  While pain is present, you may use gentle range of motion. Do not resume regular use until instructed by  your health care provider. Begin use gradually. Do not increase activity to the point of pain. If pain does develop, decrease activity and continue the above measures. Gradually increase activities that do not cause discomfort. Do this until you finally achieve normal use.  Perform low-impact activities while pain is present. Wear proper footwear.  Only take over-the-counter or prescription medicines for pain, discomfort, or fever as directed by your health care provider. SEEK MEDICAL CARE IF:   Your pain increases or pain is not controlled with medications.  You develop new, unexplained symptoms, or an increase of the symptoms that brought you to your health care provider.  Your pain and symptoms are not improving or are getting worse.   This information is not intended to replace advice given to you by your health care provider. Make sure you discuss any questions you have with your health care provider.   Document Released: 06/19/2001 Document Revised: 01/18/2014 Document Reviewed: 07/27/2012 Elsevier Interactive Patient Education [employer]2016 [employer] Inc.

## 2015-09-21 ENCOUNTER — Other Ambulatory Visit: Payer: Self-pay | Admitting: Family Medicine

## 2015-09-21 DIAGNOSIS — I10 Essential (primary) hypertension: Secondary | ICD-10-CM

## 2015-09-21 DIAGNOSIS — F419 Anxiety disorder, unspecified: Secondary | ICD-10-CM

## 2015-10-13 ENCOUNTER — Other Ambulatory Visit: Payer: Self-pay | Admitting: Family Medicine

## 2015-10-13 DIAGNOSIS — M7632 Iliotibial band syndrome, left leg: Secondary | ICD-10-CM

## 2015-10-13 MED ORDER — NAPROXEN 500 MG PO TABS: 500.0000 mg | ORAL_TABLET | Freq: Two times a day (BID) | ORAL | 5 refills | Status: DC

## 2015-10-13 NOTE — Telephone Encounter (Signed)
Pt wants a refill on her naproxen 500mg   She uses [facility] [address]  Her call back is [phone]  Thanks, Barth Kirksteri

## 2015-12-02 ENCOUNTER — Encounter: Payer: Self-pay | Admitting: Family Medicine

## 2015-12-02 ENCOUNTER — Ambulatory Visit (INDEPENDENT_AMBULATORY_CARE_PROVIDER_SITE_OTHER): Payer: Self-pay | Admitting: Family Medicine

## 2015-12-02 VITALS — BP 136/70 | HR 72 | Temp 98.2°F | Resp 16 | Wt 204.0 lb

## 2015-12-02 DIAGNOSIS — A084 Viral intestinal infection, unspecified: Secondary | ICD-10-CM

## 2015-12-02 NOTE — Patient Instructions (Signed)
Diarrhea, Adult °Introduction °Diarrhea is when you have loose and water poop (stool) often. Diarrhea can make you feel weak and cause you to get dehydrated. Dehydration can make you tired and thirsty, make you have a dry mouth, and make it so you pee (urinate) less often. Diarrhea often lasts 2-3 days. However, it can last longer if it is a sign of something more serious. It is important to treat your diarrhea as told by your doctor. °Follow these instructions at home: °Eating and drinking °Follow these recommendations as told by your doctor: °· Take an oral rehydration solution (ORS). This is a drink that is sold at pharmacies and stores. °· Drink clear fluids, such as: °¨ Water. °¨ Ice chips. °¨ Diluted fruit juice. °¨ Low-calorie sports drinks. °· Eat bland, easy-to-digest foods in small amounts as you are able. These foods include: °¨ Bananas. °¨ Applesauce. °¨ Rice. °¨ Low-fat (lean) meats. °¨ Toast. °¨ Crackers. °· Avoid drinking fluids that have a lot of sugar or caffeine in them. °· Avoid alcohol. °· Avoid spicy or fatty foods. °General instructions °· Drink enough fluid to keep your pee (urine) clear or pale yellow. °· Wash your hands often. If you cannot use soap and water, use hand sanitizer. °· Make sure that all people in your home wash their hands well and often. °· Take over-the-counter and prescription medicines only as told by your doctor. °· Rest at home while you get better. °· Watch your condition for any changes. °· Take a warm bath to help with any burning or pain from having diarrhea. °· Keep all follow-up visits as told by your doctor. This is important. °Contact a doctor if: °· You have a fever. °· Your diarrhea gets worse. °· You have new symptoms. °· You cannot keep fluids down. °· You feel light-headed or dizzy. °· You have a headache. °· You have muscle cramps. °Get help right away if: °· You have chest pain. °· You feel very weak or you pass out (faint). °· You have bloody or black  poop or poop that look like tar. °· You have very bad pain, cramping, or bloating in your belly (abdomen). °· You have trouble breathing or you are breathing very quickly. °· Your heart is beating very quickly. °· Your skin feels cold and clammy. °· You feel confused. °· You have signs of dehydration, such as: °¨ Dark pee, hardly any pee, or no pee. °¨ Cracked lips. °¨ Dry mouth. °¨ Sunken eyes. °¨ Sleepiness. °¨ Weakness. °This information is not intended to replace advice given to you by your health care provider. Make sure you discuss any questions you have with your health care provider. °Document Released: 06/16/2007 Document Revised: 07/18/2015 Document Reviewed: 09/03/2014 °© 2017 Elsevier ° °

## 2015-12-02 NOTE — Progress Notes (Signed)
       Patient: Tammy Holt Female    DOB: 1955-04-25   60 y.o.   MRN: [id number] Visit Date: 12/02/2015  Today's Provider: Mila Merryonald Illana Nolting, MD   Chief Complaint  Patient presents with  . Diarrhea   Subjective:    HPI Patient comes in today c/o nausea, vomitng, diarrhea X 2-3 days. Patient also mentions that she has had a fever up to 100 degrees. Patient reports that she has only vomited once yesterday. She woke up with diarrhea this morning. She denies any abdominal pain, cramping, bloody stool or bloating sensation. She has not been taking anything OTC for her symptoms.  She states she needs a work excuse. She works at [employer].     Allergies  Allergen Reactions  . Latex     REACTION: Hives, Blisters     Current Outpatient Prescriptions:  .  albuterol (PROAIR HFA) 108 (90 BASE) MCG/ACT inhaler, Inhale 1-2 puffs into the lungs 4 (four) times daily as needed. For wheezing, Disp: , Rfl:  .  budesonide-formoterol (SYMBICORT) 80-4.5 MCG/ACT inhaler, Inhale 2 puffs into the lungs 2 (two) times daily as needed., Disp: , Rfl:  .  lisinopril (PRINIVIL,ZESTRIL) 20 MG tablet, TAKE ONE TABLET BY MOUTH ONCE DAILY, Disp: 90 tablet, Rfl: 1 .  metFORMIN (GLUCOPHAGE) 850 MG tablet, Take 1 tablet (850 mg total) by mouth daily., Disp: 30 tablet, Rfl: 12 .  naproxen (NAPROSYN) 500 MG tablet, Take 1 tablet (500 mg total) by mouth 2 (two) times daily with a meal., Disp: 30 tablet, Rfl: 5 .  PARoxetine (PAXIL) 40 MG tablet, TAKE ONE TABLET BY MOUTH ONCE DAILY, Disp: 90 tablet, Rfl: 1 .  zolpidem (AMBIEN) 5 MG tablet, Take 1 tablet (5 mg total) by mouth at bedtime as needed for sleep., Disp: 15 tablet, Rfl: 1  Review of Systems  Constitutional: Positive for appetite change, chills, fatigue and fever.  Gastrointestinal: Positive for abdominal distention, abdominal pain, diarrhea, nausea and vomiting.  Neurological: Positive for headaches.    Social History  Substance Use Topics  . Smoking  status: Current Every Day Smoker    Packs/day: 0.75    Years: 25.00    Types: Cigarettes  . Smokeless tobacco: Never Used  . Alcohol use 0.0 oz/week     Comment: occasionally   Objective:   BP 136/70   Pulse 72   Temp 98.2 F (36.8 C)   Resp 16   Wt 204 lb (92.5 kg)   BMI 39.51 kg/m   Physical Exam  General Appearance:    Alert, cooperative, no distress  Eyes:    PERRL, conjunctiva/corneas clear, EOM's intact       Lungs:     Clear to auscultation bilaterally, respirations unlabored  Heart:    Regular rate and rhythm  Abdomen:   bowel sounds present and normal in all 4 quadrants, soft, round, nontender or nondistended. No CVA tenderness        Assessment & Plan:     1. Viral gastroenteritis Work excuse to return on 12-07-15. Counseled on symptomatic treatment. Call if any abdominal pain, fever of 101 or higher, bloody stool, or if sx last more than a week.        Mila Merryonald Danicia Terhaar, MD  [facility]

## 2016-03-03 ENCOUNTER — Ambulatory Visit: Payer: Self-pay | Admitting: Family Medicine

## 2016-03-15 ENCOUNTER — Ambulatory Visit (INDEPENDENT_AMBULATORY_CARE_PROVIDER_SITE_OTHER): Payer: Self-pay | Admitting: Family Medicine

## 2016-03-15 ENCOUNTER — Encounter: Payer: Self-pay | Admitting: Family Medicine

## 2016-03-15 VITALS — BP 122/72 | HR 72 | Temp 97.8°F | Resp 16 | Wt 206.0 lb

## 2016-03-15 DIAGNOSIS — E1121 Type 2 diabetes mellitus with diabetic nephropathy: Secondary | ICD-10-CM

## 2016-03-15 DIAGNOSIS — M25541 Pain in joints of right hand: Secondary | ICD-10-CM

## 2016-03-15 DIAGNOSIS — M25542 Pain in joints of left hand: Secondary | ICD-10-CM

## 2016-03-15 DIAGNOSIS — I10 Essential (primary) hypertension: Secondary | ICD-10-CM

## 2016-03-15 DIAGNOSIS — F17210 Nicotine dependence, cigarettes, uncomplicated: Secondary | ICD-10-CM

## 2016-03-15 DIAGNOSIS — E7800 Pure hypercholesterolemia, unspecified: Secondary | ICD-10-CM

## 2016-03-15 LAB — POCT GLYCOSYLATED HEMOGLOBIN (HGB A1C)
Est. average glucose Bld gHb Est-mCnc: 126
Hemoglobin A1C: 6

## 2016-03-15 NOTE — Patient Instructions (Signed)
Screening for lung cancer is recommended for people between 55 and 59 years of age who have smoked at 1 pack per day or more for at least 30 years. Please call our office at [phone] to schedule a low dose CT lung scan for lung cancer screening.   

## 2016-03-15 NOTE — Progress Notes (Signed)
Patient: Tammy Holt Female    DOB: 1955/12/09   61 y.o.   MRN: [id number] Visit Date: 03/15/2016  Today's Provider: Mila Merryonald Thierno Hun, MD   Chief Complaint  Patient presents with  . Diabetes  . Hypertension  . Insomnia   Subjective:    HPI  Diabetes Mellitus Type II, Follow-up:   Lab Results  Component Value Date   HGBA1C 6.0 03/15/2016   HGBA1C 5.8 09/01/2015   HGBA1C 6.0 03/03/2015    Last seen for diabetes 6 months ago.  Management since then includes no changes. She reports good compliance with treatment. She is not having side effects.  Current symptoms include none and have been stable. Home blood sugar records: trend: stable   Episodes of hypoglycemia? no   Current Insulin Regimen: none Most Recent Eye Exam: due  Weight trend: stable Prior visit with dietician: no Current diet: well balanced Current exercise: none  Pertinent Labs: No results found for: CHOL, TRIG, HDL, LDLCALC, CREATININE  Wt Readings from Last 3 Encounters:  03/15/16 206 lb (93.4 kg)  12/02/15 204 lb (92.5 kg)  09/01/15 208 lb (94.3 kg)     Hypertension, follow-up:  BP Readings from Last 3 Encounters:  03/15/16 122/72  12/02/15 136/70  09/01/15 122/68    She was last seen for hypertension 6 months ago.  BP at that visit was 122/68. Management since that visit includes no changes. She reports good compliance with treatment. She is not having side effects.  She is not exercising. She is adherent to low salt diet.   Outside blood pressures are not being checked. She is experiencing fatigue and cramping in both hands.  Patient denies exertional chest pressure/discomfort, lower extremity edema and palpitations.   Cardiovascular risk factors include diabetes mellitus.     Weight trend: stable Wt Readings from Last 3 Encounters:  03/15/16 206 lb (93.4 kg)  12/02/15 204 lb (92.5 kg)  09/01/15 208 lb (94.3 kg)    Current diet: well balanced   Insomnia, follow  up: Patient was last seen 6 months ago. Patient reports that she no longer takes Ambien 5mg . She reports that she only took a few doses, and she did not like the way it made her feel in the mornings.   She also states she has had worsening pain in swelling in joints of both hands and wrists the last 3-4 months. It is worse in the morning and takes a few hours for joints to loosen up.    Allergies  Allergen Reactions  . Latex     REACTION: Hives, Blisters     Current Outpatient Prescriptions:  .  albuterol (PROAIR HFA) 108 (90 BASE) MCG/ACT inhaler, Inhale 1-2 puffs into the lungs 4 (four) times daily as needed. For wheezing, Disp: , Rfl:  .  budesonide-formoterol (SYMBICORT) 80-4.5 MCG/ACT inhaler, Inhale 2 puffs into the lungs 2 (two) times daily as needed., Disp: , Rfl:  .  lisinopril (PRINIVIL,ZESTRIL) 20 MG tablet, TAKE ONE TABLET BY MOUTH ONCE DAILY, Disp: 90 tablet, Rfl: 1 .  metFORMIN (GLUCOPHAGE) 850 MG tablet, Take 1 tablet (850 mg total) by mouth daily., Disp: 30 tablet, Rfl: 12 .  naproxen (NAPROSYN) 500 MG tablet, Take 1 tablet (500 mg total) by mouth 2 (two) times daily with a meal., Disp: 30 tablet, Rfl: 5 .  PARoxetine (PAXIL) 40 MG tablet, TAKE ONE TABLET BY MOUTH ONCE DAILY, Disp: 90 tablet, Rfl: 1 .  zolpidem (AMBIEN) 5 MG tablet, Take  1 tablet (5 mg total) by mouth at bedtime as needed for sleep. (Patient not taking: Reported on 03/15/2016), Disp: 15 tablet, Rfl: 1  Review of Systems  Constitutional: Positive for fatigue.  Respiratory: Negative.   Cardiovascular: Negative for chest pain and leg swelling.  Gastrointestinal: Negative.   Endocrine: Negative.   Musculoskeletal: Positive for arthralgias, joint swelling and myalgias.  Neurological: Negative.     Social History  Substance Use Topics  . Smoking status: Current Every Day Smoker    Packs/day: 0.75    Years: 25.00    Types: Cigarettes  . Smokeless tobacco: Never Used  . Alcohol use 0.0 oz/week      Comment: occasionally   Objective:   BP 122/72 (BP Location: Left Arm, Patient Position: Sitting, Cuff Size: Normal)   Pulse 72   Temp 97.8 F (36.6 C)   Resp 16   Wt 206 lb (93.4 kg)   SpO2 99%   BMI 39.90 kg/m  Vitals:   03/15/16 1111  BP: 122/72  Pulse: 72  Resp: 16  Temp: 97.8 F (36.6 C)  SpO2: 99%  Weight: 206 lb (93.4 kg)     Physical Exam  General Appearance:    Alert, cooperative, no distress, obese  Eyes:    PERRL, conjunctiva/corneas clear, EOM's intact       Lungs:     Clear to auscultation bilaterally, respirations unlabored  Heart:    Regular rate and rhythm  Neurologic:   Awake, alert, oriented x 3. No apparent focal neurological           defect.   MS:   Mild swelling of DIPs in both hands.      Results for orders placed or performed in visit on 03/15/16  POCT glycosylated hemoglobin (Hb A1C)  Result Value Ref Range   Hemoglobin A1C 6.0    Est. average glucose Bld gHb Est-mCnc 126        Assessment & Plan:     1. Diabetes mellitus with nephropathy (HCC) - Lipid panel - POCT glycosylated hemoglobin (Hb A1C) - Comprehensive metabolic panel  2. Arthralgia of both hands  - ANA w/Reflex if Positive - Rheumatoid factor - Sedimentation rate  3. Smoking greater than 30 pack years Given printed information and recommendations regarding LDCT screening.   4. Essential hypertension Well controlled.  Continue current medications.   - Comprehensive metabolic panel           Mila Merry, MD  [facility]

## 2016-04-27 ENCOUNTER — Ambulatory Visit (INDEPENDENT_AMBULATORY_CARE_PROVIDER_SITE_OTHER): Payer: Self-pay | Admitting: Family Medicine

## 2016-04-27 ENCOUNTER — Encounter: Payer: Self-pay | Admitting: Family Medicine

## 2016-04-27 VITALS — BP 124/70 | HR 80 | Temp 98.8°F | Resp 16 | Wt 203.0 lb

## 2016-04-27 DIAGNOSIS — K529 Noninfective gastroenteritis and colitis, unspecified: Secondary | ICD-10-CM

## 2016-04-27 NOTE — Progress Notes (Signed)
Patient: Tammy Holt Female    DOB: 1955-09-01   61 y.o.   MRN: [id number] Visit Date: 04/27/2016  Today's Provider: Mila Merry, MD   Chief Complaint  Patient presents with  . Abdominal Pain  . Diarrhea   Subjective:    Patient stated that 2 days ago she had a pain in her lower abdomin that doubled her over. Patient stated that pain was a cramping pain. Patient stated that she began having diarrhea and nausea that lasted until last night. Patient stated that symptoms have improved today. However she is still nauseous. Patient did not take any otc medications.   Abdominal Pain  This is a new problem. The current episode started in the past 7 days (2 days ago). The onset quality is sudden. The problem occurs constantly. The problem has been gradually improving. The pain is located in the generalized abdominal region. The pain is at a severity of 6/10. The pain is moderate. The quality of the pain is cramping. The abdominal pain does not radiate. Associated symptoms include belching and nausea. Pertinent negatives include no anorexia, constipation, dysuria, frequency, hematochezia, hematuria or melena. The pain is relieved by liquids. She has tried nothing for the symptoms. The treatment provided mild relief. There is no history of abdominal surgery, colon cancer, Crohn's disease, gallstones, GERD, pancreatitis, PUD or ulcerative colitis.       Allergies  Allergen Reactions  . Latex     REACTION: Hives, Blisters     Current Outpatient Prescriptions:  .  albuterol (PROAIR HFA) 108 (90 BASE) MCG/ACT inhaler, Inhale 1-2 puffs into the lungs 4 (four) times daily as needed. For wheezing, Disp: , Rfl:  .  lisinopril (PRINIVIL,ZESTRIL) 20 MG tablet, TAKE ONE TABLET BY MOUTH ONCE DAILY, Disp: 90 tablet, Rfl: 1 .  naproxen (NAPROSYN) 500 MG tablet, Take 1 tablet (500 mg total) by mouth 2 (two) times daily with a meal., Disp: 30 tablet, Rfl: 5 .  PARoxetine (PAXIL) 40 MG tablet,  TAKE ONE TABLET BY MOUTH ONCE DAILY, Disp: 90 tablet, Rfl: 1 .  metFORMIN (GLUCOPHAGE) 850 MG tablet, Take 1 tablet (850 mg total) by mouth daily. (Patient not taking: Reported on 04/27/2016), Disp: 30 tablet, Rfl: 12  Review of Systems  Constitutional: Negative for appetite change and fatigue.  Respiratory: Negative for chest tightness and shortness of breath.   Cardiovascular: Negative for chest pain and palpitations.  Gastrointestinal: Positive for abdominal pain and nausea. Negative for anorexia, constipation, hematochezia and melena.  Genitourinary: Negative for dysuria, frequency and hematuria.  Neurological: Negative for dizziness and weakness.    Social History  Substance Use Topics  . Smoking status: Current Every Day Smoker    Packs/day: 0.75    Years: 40.00    Types: Cigarettes  . Smokeless tobacco: Never Used     Comment: started at age 71 1/2-1 ppd  . Alcohol use 0.0 oz/week     Comment: occasionally   Objective:   BP 124/70 (BP Location: Right Arm, Patient Position: Sitting, Cuff Size: Large)   Pulse 80   Temp 98.8 F (37.1 C) (Oral)   Resp 16   Wt 203 lb (92.1 kg)   SpO2 96%   BMI 39.32 kg/m  Vitals:   04/27/16 1058  BP: 124/70  Pulse: 80  Resp: 16  Temp: 98.8 F (37.1 C)  TempSrc: Oral  SpO2: 96%  Weight: 203 lb (92.1 kg)     Physical Exam  General Appearance:  Alert, cooperative, no distress  Eyes:    PERRL, conjunctiva/corneas clear, EOM's intact       Lungs:     Clear to auscultation bilaterally, respirations unlabored  Heart:    Regular rate and rhythm  Abdomen:   bowel sounds present and normal in all 4 quadrants, soft, round, nontender or nondistended. No CVA tenderness       Assessment & Plan:     1. Gastroenteritis Now mostly resolved. Work excuse provided. Dietary precautions discussed. Call if not continue to improve or if symptoms not completely resolved within a week.      The entirety of the information documented in the  History of Present Illness, Review of Systems and Physical Exam were personally obtained by me. Portions of this information were initially documented by April M. Hyacinth Meeker, CMA and reviewed by me for thoroughness and accuracy.    Mila Merry, MD  [facility]

## 2016-04-27 NOTE — Patient Instructions (Signed)

## 2016-05-20 ENCOUNTER — Other Ambulatory Visit: Payer: Self-pay | Admitting: Family Medicine

## 2016-05-20 DIAGNOSIS — F419 Anxiety disorder, unspecified: Secondary | ICD-10-CM

## 2016-05-20 DIAGNOSIS — I10 Essential (primary) hypertension: Secondary | ICD-10-CM

## 2016-05-20 MED ORDER — LISINOPRIL 20 MG PO TABS: 20.0000 mg | ORAL_TABLET | Freq: Every day | ORAL | 4 refills | Status: DC

## 2016-05-20 MED ORDER — PAROXETINE HCL 40 MG PO TABS: 40.0000 mg | ORAL_TABLET | Freq: Every day | ORAL | 4 refills | Status: DC

## 2016-05-20 NOTE — Telephone Encounter (Signed)
Pt needs refill   PARoxetine (PAXIL) 40 MG tablet  lisinopril (PRINIVIL,ZESTRIL) 20 MG tablet   She also would like to start back on the generic xanax  [address]  [place]

## 2016-09-21 ENCOUNTER — Ambulatory Visit (INDEPENDENT_AMBULATORY_CARE_PROVIDER_SITE_OTHER): Payer: Self-pay | Admitting: Family Medicine

## 2016-09-21 ENCOUNTER — Encounter: Payer: Self-pay | Admitting: Family Medicine

## 2016-09-21 VITALS — BP 136/74 | HR 80 | Temp 98.3°F | Ht 60.0 in | Wt 210.8 lb

## 2016-09-21 DIAGNOSIS — R519 Headache, unspecified: Secondary | ICD-10-CM

## 2016-09-21 DIAGNOSIS — I10 Essential (primary) hypertension: Secondary | ICD-10-CM

## 2016-09-21 DIAGNOSIS — R51 Headache: Secondary | ICD-10-CM

## 2016-09-21 DIAGNOSIS — J301 Allergic rhinitis due to pollen: Secondary | ICD-10-CM

## 2016-09-21 NOTE — Progress Notes (Signed)
Patient: Tammy Holt Female    DOB: 07-05-1955   61 y.o.   MRN: [id number] Visit Date: 09/21/2016  Today's Provider: Dortha Kernennis Dareen Gutzwiller, PA   Chief Complaint  Patient presents with  . Headache   Subjective:     Unable to complete Health Maintenance today due to patient being uninsured. She is unable to pay for vaccines and labs.  Headache   This is a new problem. The current episode started yesterday. The problem has been resolved. The pain is located in the temporal region. The pain quality is similar to prior headaches. The quality of the pain is described as aching. Exacerbated by: eating BBQ. Treatments tried: OTC allergy medication. The treatment provided significant relief.  Patient states the headache has resolved today, but she was out of work yesterday. She needs a note to be excused from work on 09/20/16. She states she is not able to return to work unless she has a excuse. Patient is employed at [employer].   Past Medical History:  Diagnosis Date  . Asthma   . Diabetes mellitus without complication (HCC)   . GERD (gastroesophageal reflux disease)   . History of chicken pox   . Hyperlipidemia   . Hypertension   . Panic disorder    Past Surgical History:  Procedure Laterality Date  . ABDOMINAL HYSTERECTOMY  1990   Vaginal. Partial. Due to cervical cancer. One ovary resected + right cyst  . Abdominal Ultrasound  2011   large incisional hernia and ovarian cyst  . APPENDECTOMY    . CHOLECYSTECTOMY  1990  . COLECTOMY  03/2009   partial right colectomy for multiple polyps- all beninign (incidental appendectomy)  . COLONOSCOPY  2010   multiple colon polyps with single large polyp, internal hemorrhoids. Dr. Niel HummerIftikhar. [facility]  . HERNIA REPAIR  09/2010   Incisional hernia repair   Family History  Problem Relation Age of Onset  . Diabetes Mother   . Stroke Mother   . Depression Mother   . Alcohol abuse Father   . Cirrhosis Father     of the liver  . Colon cancer Sister   . Anxiety disorder Sister   . Lupus Sister   . COPD Sister    Allergies  Allergen Reactions  . Latex     REACTION: Hives, Blisters     Previous Medications   ALBUTEROL (PROAIR HFA) 108 (90 BASE) MCG/ACT INHALER    Inhale 1-2 puffs into the lungs 4 (four) times daily as needed. For wheezing   LISINOPRIL (PRINIVIL,ZESTRIL) 20 MG TABLET    Take 1 tablet (20 mg total) by mouth daily.   METFORMIN (GLUCOPHAGE) 850 MG TABLET    Take 1 tablet (850 mg total) by mouth daily.   NAPROXEN (NAPROSYN) 500 MG TABLET    Take 1 tablet (500 mg total) by mouth 2 (two) times daily with a meal.   PAROXETINE (PAXIL) 40 MG TABLET    Take 1 tablet (40 mg total) by mouth daily.    Review of Systems  Constitutional: Negative.   Respiratory: Negative.   Cardiovascular: Negative.   Neurological: Positive for headaches (resolved).    Social History  Substance Use Topics  . Smoking status: Current Every Day Smoker    Packs/day: 0.75    Years: 40.00    Types: Cigarettes  . Smokeless tobacco: Never Used     Comment: started at age 61 1/2-1 ppd  . Alcohol use 0.0 oz/week  Comment: occasionally   Objective:   BP 136/74 (BP Location: Right Arm, Patient Position: Sitting, Cuff Size: Normal)   Pulse 80   Temp 98.3 F (36.8 C) (Oral)   Ht 5' (1.524 m)   Wt 210 lb 12.8 oz (95.6 kg)   SpO2 94%   BMI 41.17 kg/m   Physical Exam  Constitutional: She is oriented to person, place, and time. She appears well-developed and well-nourished. No distress.  HENT:  Head: Normocephalic and atraumatic.  Right Ear: Hearing and external ear normal.  Left Ear: Hearing and external ear normal.  Nose: Nose normal.  Mouth/Throat: Oropharynx is clear and moist.  Eyes: Conjunctivae and lids are normal. Right eye exhibits no discharge. Left eye exhibits no discharge. No scleral icterus.  Cardiovascular: Normal rate and regular rhythm.   Pulmonary/Chest: Effort normal and breath  sounds normal. No respiratory distress.  Abdominal: Soft. Bowel sounds are normal.  Scar from herniorrhaphy with mesh in the RLQ.  Musculoskeletal: Normal range of motion.  Neurological: She is alert and oriented to person, place, and time.  Skin: Skin is intact. No lesion and no rash noted.  Psychiatric: She has a normal mood and affect. Her speech is normal and behavior is normal. Thought content normal.      Assessment & Plan:     1. Mixed headache Throbbing headache yesterday that lasted 2 hours. Took Ibuprofen and Sudafed (for HBP) and went to sleep to get the headache to abate. No nausea but slight bright light sensitivity during headache. No residual discomfort today. Given note to take to work to document out of work yesterday.  2. Essential hypertension Well controlled on Lisinopril 20 mg qd. Recheck prn.  3. Non-seasonal allergic rhinitis due to pollen Using some OTC Sudafed-HBP and Albuterol prn rhinitis and asthma. Still smoking but plans to stop in the next couple weeks. Given prescription for Albuterol Inhaler refill (wants to check on cost since she does not have any insurance). May use OTC antihistamine prn rhinorrhea and PND.

## 2017-02-05 ENCOUNTER — Encounter: Payer: Self-pay | Admitting: Family Medicine

## 2017-02-05 ENCOUNTER — Ambulatory Visit (INDEPENDENT_AMBULATORY_CARE_PROVIDER_SITE_OTHER): Payer: Self-pay | Admitting: Family Medicine

## 2017-02-05 VITALS — BP 158/82 | HR 79 | Temp 98.6°F | Resp 16 | Wt 215.6 lb

## 2017-02-05 DIAGNOSIS — I10 Essential (primary) hypertension: Secondary | ICD-10-CM

## 2017-02-05 DIAGNOSIS — J441 Chronic obstructive pulmonary disease with (acute) exacerbation: Secondary | ICD-10-CM | POA: Insufficient documentation

## 2017-02-05 DIAGNOSIS — E1121 Type 2 diabetes mellitus with diabetic nephropathy: Secondary | ICD-10-CM

## 2017-02-05 DIAGNOSIS — F17210 Nicotine dependence, cigarettes, uncomplicated: Secondary | ICD-10-CM

## 2017-02-05 MED ORDER — DOXYCYCLINE HYCLATE 100 MG PO TABS: 100.0000 mg | ORAL_TABLET | Freq: Two times a day (BID) | ORAL | 0 refills | Status: DC

## 2017-02-05 MED ORDER — PREDNISONE 10 MG (21) PO TBPK: ORAL_TABLET | ORAL | 0 refills | Status: DC

## 2017-02-05 NOTE — Progress Notes (Signed)
Patient ID: Tammy SilenceCindy C Luzier, female   DOB: 03-Feb-1955, 62 y.o.   MRN: [id number]        Patient: Tammy Holt Female    DOB: 03-Feb-1955   62 y.o.   MRN: [id number] Visit Date: 02/05/2017  Today's Provider: Megan Mansichard Dynasti Kerman Jr, MD   Chief Complaint  Patient presents with  . Cough   Subjective:    Cough  This is a new problem. The current episode started in the past 7 days. The problem has been gradually worsening. The problem occurs constantly. The cough is productive of sputum. Associated symptoms include ear congestion, a fever, nasal congestion, postnasal drip, rhinorrhea, shortness of breath and wheezing. Pertinent negatives include no chest pain, chills, ear pain, headaches, heartburn, hemoptysis, myalgias, sore throat, sweats or weight loss. The symptoms are aggravated by lying down. Risk factors for lung disease include smoking/tobacco exposure. She has tried steroid inhaler for the symptoms. The treatment provided mild relief. Her past medical history is significant for asthma.       Allergies  Allergen Reactions  . Latex     REACTION: Hives, Blisters     Current Outpatient Medications:  .  albuterol (PROAIR HFA) 108 (90 BASE) MCG/ACT inhaler, Inhale 1-2 puffs into the lungs 4 (four) times daily as needed. For wheezing, Disp: , Rfl:  .  lisinopril (PRINIVIL,ZESTRIL) 20 MG tablet, Take 1 tablet (20 mg total) by mouth daily., Disp: 90 tablet, Rfl: 4 .  metFORMIN (GLUCOPHAGE) 850 MG tablet, Take 1 tablet (850 mg total) by mouth daily., Disp: 30 tablet, Rfl: 12 .  PARoxetine (PAXIL) 40 MG tablet, Take 1 tablet (40 mg total) by mouth daily., Disp: 90 tablet, Rfl: 4 .  naproxen (NAPROSYN) 500 MG tablet, Take 1 tablet (500 mg total) by mouth 2 (two) times daily with a meal. (Patient not taking: Reported on 02/05/2017), Disp: 30 tablet, Rfl: 5  Review of Systems  Constitutional: Positive for fever. Negative for chills and weight loss.  HENT: Positive for postnasal drip and  rhinorrhea. Negative for ear pain and sore throat.   Eyes: Negative.   Respiratory: Positive for cough, shortness of breath and wheezing. Negative for hemoptysis.   Cardiovascular: Negative.  Negative for chest pain.  Gastrointestinal: Negative.  Negative for heartburn.  Endocrine: Negative.   Genitourinary: Negative.   Musculoskeletal: Negative for myalgias.  Allergic/Immunologic: Negative.   Neurological: Negative for headaches.  Hematological: Negative.   Psychiatric/Behavioral: Negative.     Social History   Tobacco Use  . Smoking status: Current Every Day Smoker    Packs/day: 0.75    Years: 40.00    Pack years: 30.00    Types: Cigarettes  . Smokeless tobacco: Never Used  . Tobacco comment: started at age 62 1/2-1 ppd  Substance Use Topics  . Alcohol use: Yes    Alcohol/week: 0.0 oz    Comment: occasionally   Objective:   BP (!) 158/82   Pulse 79   Temp 98.6 F (37 C) (Oral)   Resp 16   Wt 215 lb 9.6 oz (97.8 kg)   SpO2 96%   BMI 42.11 kg/m  Vitals:   02/05/17 0909  BP: (!) 158/82  Pulse: 79  Resp: 16  Temp: 98.6 F (37 C)  TempSrc: Oral  SpO2: 96%  Weight: 215 lb 9.6 oz (97.8 kg)     Physical Exam  Constitutional: She is oriented to person, place, and time. She appears well-developed and well-nourished.  HENT:  Head: Normocephalic and atraumatic.  Eyes: Conjunctivae are normal. No scleral icterus.  Neck: No thyromegaly present.  Cardiovascular: Normal rate, regular rhythm and normal heart sounds.  Pulmonary/Chest: Effort normal and breath sounds normal.  Rhonchi in left base,improved after xopenex nebulizer.  Abdominal: Soft.  Neurological: She is alert and oriented to person, place, and time.  Skin: Skin is warm and dry.  Psychiatric: She has a normal mood and affect. Her behavior is normal. Judgment and thought content normal.        Assessment & Plan:     COPD exacerbation Mild--CXR if she worsens--out of work today. Doxycycline and 6  day prednisone taper.Pt state she has nebulizer at home. Tobacco Abuse Pt advised to quit.     I have done the exam and reviewed the above chart and it is accurate to the best of my knowledge. Dentist has been used in this note in any air is in the dictation or transcription are unintentional.  Megan Mans, MD  [facility]

## 2017-03-03 ENCOUNTER — Other Ambulatory Visit: Payer: Self-pay | Admitting: Family Medicine

## 2017-03-03 NOTE — Telephone Encounter (Signed)
Pt contacted office for refill request on the following medications:  1. predniSONE (STERAPRED UNI-PAK 21 TAB) 10 MG (21) TBPK tablet  2. doxycycline (VIBRA-TABS) 100 MG tablet  [facility] [address]  Pt stated that she that she saw Dr. Sullivan LoneGilbert on 02/05/17 and started taking the above medications. Pt is requesting refills on both medications because all of her symptoms have returned. Pt stated that she doesn't have insurance and she can't afford to pay for an OV and the medications. Please advise. Thanks TNP

## 2017-03-03 NOTE — Telephone Encounter (Signed)
Dr Sullivan LoneGilbert, I don't think she is actually your patient.  You saw her for an acute visit.

## 2017-03-03 NOTE — Telephone Encounter (Signed)
Ok with me for 6 days.

## 2017-03-04 MED ORDER — PREDNISONE 10 MG (21) PO TBPK: ORAL_TABLET | ORAL | 0 refills | Status: DC

## 2017-03-04 MED ORDER — DOXYCYCLINE HYCLATE 100 MG PO TABS: 100.0000 mg | ORAL_TABLET | Freq: Two times a day (BID) | ORAL | 0 refills | Status: DC

## 2017-05-10 ENCOUNTER — Encounter: Payer: Self-pay | Admitting: Family Medicine

## 2017-05-10 ENCOUNTER — Ambulatory Visit (INDEPENDENT_AMBULATORY_CARE_PROVIDER_SITE_OTHER): Payer: Self-pay | Admitting: Family Medicine

## 2017-05-10 DIAGNOSIS — F32A Depression, unspecified: Secondary | ICD-10-CM

## 2017-05-10 DIAGNOSIS — M7632 Iliotibial band syndrome, left leg: Secondary | ICD-10-CM

## 2017-05-10 DIAGNOSIS — F329 Major depressive disorder, single episode, unspecified: Secondary | ICD-10-CM

## 2017-05-10 MED ORDER — NAPROXEN 500 MG PO TABS: 500.0000 mg | ORAL_TABLET | Freq: Two times a day (BID) | ORAL | 5 refills | Status: DC

## 2017-05-10 MED ORDER — METHYLPREDNISOLONE ACETATE 80 MG/ML IJ SUSP
80.0000 mg | Freq: Once | INTRAMUSCULAR | Status: AC
  Administered 2017-05-10: 80 mg via INTRAMUSCULAR

## 2017-05-10 MED ORDER — ESCITALOPRAM OXALATE 20 MG PO TABS: 20.0000 mg | ORAL_TABLET | Freq: Every day | ORAL | 5 refills | Status: DC

## 2017-05-10 NOTE — Progress Notes (Signed)
Patient: Tammy Holt Female    DOB: [birth year]   62 y.o.   MRN: [id number] Visit Date: 05/10/2017  Today's Provider: Mila Merry, MD   Chief Complaint  Patient presents with  . Leg Pain   Subjective:    Patient is here to discuss recurrent leg pain. Patient has been seen in the past for this. Left leg is worse with numbness and stabbing pain. Pain goes from knee to hip. Patient has been taking naproxen for pain, however the medication is not helping.  She had extensive rheumatology labs ordered last year, but she never had them done. She states she has had several falls over the last few months due to pain and weakness in left leg.    Diabetes Mellitus Type II, Follow-up:   Lab Results  Component Value Date   HGBA1C 6.0 03/15/2016   HGBA1C 5.8 09/01/2015   HGBA1C 6.0 03/03/2015   Last seen for diabetes 03/15/2016.  Management since then includes; no changes. She states she has been off of metformin for at least 4 months.   ------------------------------------------------------------------------   Hypertension, follow-up:  BP Readings from Last 3 Encounters:  05/10/17 (!) 110/58  02/05/17 (!) 158/82  09/21/16 136/74    She was last seen for hypertension 03/15/2016.  BP at that visit was 122/72. Management since that visit includes; labs ordered, but none were done.She reports good compliance with treatment. She is not having side effects.  She is not exercising. Patient denies chest pain, dyspnea, fatigue and palpitations.    ------------------------------------------------------------------------  She also requests change of depression medications. She states she has been taking Paxil for many years and it doesn't seem to help anymore.   Allergies  Allergen Reactions  . Latex     REACTION: Hives, Blisters     Current Outpatient Medications:  .  albuterol (PROAIR HFA) 108 (90 BASE) MCG/ACT inhaler, Inhale 1-2 puffs into the lungs 4 (four) times  daily as needed. For wheezing, Disp: , Rfl:  .  lisinopril (PRINIVIL,ZESTRIL) 20 MG tablet, Take 1 tablet (20 mg total) by mouth daily., Disp: 90 tablet, Rfl: 4 .  naproxen (NAPROSYN) 500 MG tablet, Take 1 tablet (500 mg total) by mouth 2 (two) times daily with a meal., Disp: 30 tablet, Rfl: 5 .  PARoxetine (PAXIL) 40 MG tablet, Take 1 tablet (40 mg total) by mouth daily., Disp: 90 tablet, Rfl: 4 .  metFORMIN (GLUCOPHAGE) 850 MG tablet, Take 1 tablet (850 mg total) by mouth daily. (Patient not taking: Reported on 05/10/2017), Disp: 30 tablet, Rfl: 12 .  predniSONE (STERAPRED UNI-PAK 21 TAB) 10 MG (21) TBPK tablet, 6-5-4-3-2-1 (Patient not taking: Reported on 05/10/2017), Disp: 21 tablet, Rfl: 0  Review of Systems  Constitutional: Negative for appetite change, chills, fatigue and fever.  Respiratory: Negative for chest tightness and shortness of breath.   Cardiovascular: Negative for chest pain and palpitations.  Gastrointestinal: Negative for abdominal pain, nausea and vomiting.  Neurological: Negative for dizziness and weakness.    Social History   Tobacco Use  . Smoking status: Current Every Day Smoker    Packs/day: 0.75    Years: 40.00    Pack years: 30.00    Types: Cigarettes  . Smokeless tobacco: Never Used  . Tobacco comment: started at age 25 1/2-1 ppd  Substance Use Topics  . Alcohol use: Yes    Alcohol/week: 0.0 oz    Comment: occasionally   Objective:   BP (!) 110/58 (BP Location:  Right Arm, Patient Position: Sitting, Cuff Size: Large)   Pulse 75   Temp 98.2 F (36.8 C) (Oral)   Resp 16   Wt 212 lb (96.2 kg)   SpO2 95%   BMI 41.40 kg/m  Vitals:   05/10/17 1125  BP: (!) 110/58  Pulse: 75  Resp: 16  Temp: 98.2 F (36.8 C)  TempSrc: Oral  SpO2: 95%  Weight: 212 lb (96.2 kg)     Physical Exam  General appearance: alert, well developed, well nourished, cooperative and in no distress Head: Normocephalic, without obvious abnormality, atraumatic Respiratory:  Respirations even and unlabored, normal respiratory rate Extremities: Moderate tenderness along left lateral upper and lower leg. No gross deformities. Negative straight leg raise. No tenderness of lumbar spine, para lumbar muscles, or SI area. No swelling, no erythema.      Assessment & Plan:     1. Iliotibial band syndrome of left side  - naproxen (NAPROSYN) 500 MG tablet; Take 1 tablet (500 mg total) by mouth 2 (two) times daily with a meal.  Dispense: 30 tablet; Refill: 5 - methylPREDNISolone acetate (DEPO-MEDROL) injection 80 mg Consider PT referral if symptoms return or do no respond to above.   2. Depression, unspecified depression type Change from paroxetine to- escitalopram (LEXAPRO) 20 MG tablet; Take 1 tablet (20 mg total) by mouth daily.  Dispense: 30 tablet; Refill: 5 As she does not think paroxetine is effective anymore.       Mila Merry, MD  [facility]

## 2017-05-12 ENCOUNTER — Telehealth: Payer: Self-pay | Admitting: Family Medicine

## 2017-05-12 DIAGNOSIS — M7632 Iliotibial band syndrome, left leg: Secondary | ICD-10-CM

## 2017-05-12 NOTE — Telephone Encounter (Signed)
Pt stated that [facility] advised her they can not fill the naproxen (NAPROSYN) 500 MG tablet because the possible interaction of taking that with escitalopram (LEXAPRO) 20 MG tablet. Pt stated that [facility] advised pt they had sent info to out office and were waiting to hear from [place]. Please advise. Thanks TNP

## 2017-05-12 NOTE — Telephone Encounter (Signed)
Please advise 

## 2017-05-13 NOTE — Telephone Encounter (Signed)
[facility] was advised.

## 2017-05-13 NOTE — Telephone Encounter (Signed)
Please advised pharmacy I am aware of possible interaction and am monitoring. Ok to fill naprosyn and escitalopram.

## 2017-05-31 ENCOUNTER — Other Ambulatory Visit: Payer: Self-pay | Admitting: Family Medicine

## 2017-05-31 DIAGNOSIS — I10 Essential (primary) hypertension: Secondary | ICD-10-CM

## 2017-07-22 ENCOUNTER — Emergency Department
Admission: EM | Admit: 2017-07-22 | Discharge: 2017-07-22 | Disposition: A | Discharge status: Home / Self Care | Payer: Self-pay | Attending: Emergency Medicine | Admitting: Emergency Medicine

## 2017-07-22 ENCOUNTER — Other Ambulatory Visit: Payer: Self-pay

## 2017-07-22 ENCOUNTER — Encounter: Payer: Self-pay | Admitting: Emergency Medicine

## 2017-07-22 ENCOUNTER — Emergency Department: Payer: Self-pay

## 2017-07-22 DIAGNOSIS — W19XXXA Unspecified fall, initial encounter: Secondary | ICD-10-CM | POA: Insufficient documentation

## 2017-07-22 DIAGNOSIS — F17210 Nicotine dependence, cigarettes, uncomplicated: Secondary | ICD-10-CM | POA: Insufficient documentation

## 2017-07-22 DIAGNOSIS — E119 Type 2 diabetes mellitus without complications: Secondary | ICD-10-CM | POA: Insufficient documentation

## 2017-07-22 DIAGNOSIS — M1711 Unilateral primary osteoarthritis, right knee: Secondary | ICD-10-CM | POA: Insufficient documentation

## 2017-07-22 DIAGNOSIS — M25561 Pain in right knee: Secondary | ICD-10-CM

## 2017-07-22 DIAGNOSIS — Z79899 Other long term (current) drug therapy: Secondary | ICD-10-CM | POA: Insufficient documentation

## 2017-07-22 DIAGNOSIS — J449 Chronic obstructive pulmonary disease, unspecified: Secondary | ICD-10-CM | POA: Insufficient documentation

## 2017-07-22 DIAGNOSIS — I10 Essential (primary) hypertension: Secondary | ICD-10-CM | POA: Insufficient documentation

## 2017-07-22 DIAGNOSIS — Z91040 Latex allergy status: Secondary | ICD-10-CM | POA: Insufficient documentation

## 2017-07-22 DIAGNOSIS — Y929 Unspecified place or not applicable: Secondary | ICD-10-CM | POA: Insufficient documentation

## 2017-07-22 DIAGNOSIS — Y939 Activity, unspecified: Secondary | ICD-10-CM | POA: Insufficient documentation

## 2017-07-22 DIAGNOSIS — Y999 Unspecified external cause status: Secondary | ICD-10-CM | POA: Insufficient documentation

## 2017-07-22 MED ORDER — DICLOFENAC SODIUM 50 MG PO TBEC: 50.0000 mg | DELAYED_RELEASE_TABLET | Freq: Two times a day (BID) | ORAL | 0 refills | Status: AC

## 2017-07-22 NOTE — Discharge Instructions (Signed)
Your exam and x-ray reveals some mild arthritis to the inside of the joint. Take the prescription anti-inflammatory as directed. Follow-up with orthopedics for further evaluation and management.

## 2017-07-22 NOTE — ED Notes (Signed)
See triage note. Pt c/o R knee pain after falling a month and again this morning. Has seen PCP about fall a month ago and was told she sprained her knee

## 2017-07-22 NOTE — ED Triage Notes (Signed)
C/O right knee pain x 1 month.  States fell about one month ago and fell again this morning.  Arrives ambulating independently.  NAD.  Declines offer for wheelchair.

## 2017-07-23 NOTE — ED Provider Notes (Signed)
[facility] Emergency Department Provider Note ____________________________________________  Time seen: 1551  I have reviewed the triage vital signs and the nursing notes.  HISTORY  Chief Complaint  Knee Pain  HPI Tammy Holt is a 62 y.o. female presents herself to the ED for evaluation of right knee pain.  Patient reports about a 1 month complaint of intermittent right knee pain.  She describes she initially had a fall about a month prior due to some give way in the knee.  She is had intermittent pain since that time.  She reports falling again this morning and has continued to have some pain to the right knee.  She reports previous evaluation her primary provider, who placed on naproxen.  She denies any significant benefit with this daily medication.  She reports some tightness and swelling to the right knee but is unaware of any history of chronic ongoing pain.  She denies any lower extremity swelling, chest pain, or shortness of breath.  Past Medical History:  Diagnosis Date  . Asthma   . Diabetes mellitus without complication (HCC)   . GERD (gastroesophageal reflux disease)   . History of chicken pox   . Hyperlipidemia   . Hypertension   . Panic disorder     Patient Active Problem List   Diagnosis Date Noted  . Depression 05/10/2017  . COPD exacerbation (HCC) 02/05/2017  . 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 GERD (gastroesophageal reflux disease) 09/23/2014  . Hematuria 09/23/2014  . Incisional hernia 09/23/2014  . History of cervical cancer 09/23/2014  . History of gastric ulcer 09/23/2014  . Menopausal symptoms 09/23/2014  . Proteinuria 09/23/2014  . Tension type headache 09/23/2014  . Urinary incontinence 09/23/2014  . Insomnia 09/23/2014  . Acute anxiety 11/25/2009  . Essential hypertension 11/25/2009  . Cyst of ovary 02/09/2009  . Internal hemorrhoids without complication 01/15/2009  . History of colon polyps  12/11/2008  . External hemorrhoids without complication 10/29/2008  . Asthma without status asthmaticus 05/14/2008  . Allergic rhinitis 04/11/2007  . Smoking greater than 30 pack years 04/11/2007  . Panic disorder 04/11/2007  . Pure hypercholesterolemia 04/11/2007  . Overflow incontinence 04/11/2007    Past Surgical History:  Procedure Laterality Date  . ABDOMINAL HYSTERECTOMY  1990   Vaginal. Partial. Due to cervical cancer. One ovary resected + right cyst  . Abdominal Ultrasound  2011   large incisional hernia and ovarian cyst  . APPENDECTOMY    . CHOLECYSTECTOMY  1990  . COLECTOMY  03/2009   partial right colectomy for multiple polyps- all beninign (incidental appendectomy)  . COLONOSCOPY  2010   multiple colon polyps with single large polyp, internal hemorrhoids. Dr. Niel Hummer. Referral to [facility] surgery for resection  . HERNIA REPAIR  09/2010   Incisional hernia repair    Prior to Admission medications   Medication Sig Start Date End Date Taking? Authorizing Provider  albuterol (PROAIR HFA) 108 (90 BASE) MCG/ACT inhaler Inhale 1-2 puffs into the lungs 4 (four) times daily as needed. For wheezing 01/18/11   [provider]  diclofenac (VOLTAREN) 50 MG EC tablet Take 1 tablet (50 mg total) by mouth 2 (two) times daily. 07/22/17 08/21/17  Jaris Kohles, Charlesetta Ivory, PA-C  escitalopram (LEXAPRO) 20 MG tablet Take 1 tablet (20 mg total) by mouth daily. 05/10/17   [name], MD  lisinopril (PRINIVIL,ZESTRIL) 20 MG tablet TAKE 1 TABLET BY MOUTH ONCE DAILY 05/31/17   [name], MD  naproxen (NAPROSYN) 500  MG tablet Take 1 tablet (500 mg total) by mouth 2 (two) times daily with a meal. 05/10/17   [name], MD    Allergies Latex  Family History  Problem Relation Age of Onset  . Diabetes Mother   . Stroke Mother   . Depression Mother   . Alcohol abuse Father   . Cirrhosis Father        of the liver  . Colon cancer Sister   . Anxiety disorder Sister   .  Lupus Sister   . COPD Sister     Social History Social History   Tobacco Use  . Smoking status: Current Every Day Smoker    Packs/day: 0.75    Years: 40.00    Pack years: 30.00    Types: Cigarettes  . Smokeless tobacco: Never Used  . Tobacco comment: started at age 78 1/2-1 ppd  Substance Use Topics  . Alcohol use: Yes    Alcohol/week: 0.0 oz    Comment: occasionally  . Drug use: No    Review of Systems  Constitutional: Negative for fever. Cardiovascular: Negative for chest pain. Respiratory: Negative for shortness of breath. Genitourinary: Negative for dysuria. Musculoskeletal: Negative for back pain.  Right knee pain as above.   Skin: Negative for rash. Neurological: Negative for headaches, focal weakness or numbness. ____________________________________________  PHYSICAL EXAM:  VITAL SIGNS: ED Triage Vitals  Enc Vitals Group     BP 07/22/17 1539 (!) 154/60     Pulse Rate 07/22/17 1539 85     Resp 07/22/17 1539 18     Temp 07/22/17 1539 98.7 F (37.1 C)     Temp src --      SpO2 07/22/17 1539 96 %     Weight 07/22/17 1534 200 lb (90.7 kg)     Height 07/22/17 1534 5\' 4"  (1.626 m)     Head Circumference --      Peak Flow --      Pain Score 07/22/17 1645 3     Pain Loc --      Pain Edu? --      Excl. in GC? --     Constitutional: Alert and oriented. Well appearing and in no distress. Head: Normocephalic and atraumatic. Cardiovascular: Normal rate, regular rhythm. Normal distal pulses. Respiratory: Normal respiratory effort.  Musculoskeletal: Right knee without obvious deformity, dislocation, or effusion.  Patient with normal flexion extension range of the knee without difficulty.  No popliteal space fullness is noted.  Patient without any significant calf or Achilles tenderness noted distally.  Nontender with normal range of motion in all extremities.  Neurologic:  Normal gait without ataxia. Normal speech and language. No gross focal neurologic deficits are  appreciated. Skin:  Skin is warm, dry and intact. No rash noted. ____________________________________________   RADIOLOGY  Right Knee IMPRESSION: No acute or traumatic finding. Osteoarthritis most pronounced in the medial compartment. ____________________________________________  INITIAL IMPRESSION / ASSESSMENT AND PLAN / ED COURSE  ED evaluation of 1 month complaint of intermittent right knee pain.  Patient's x-ray reveals some mild to moderate medial compartment osteoarthritis which is consistent with the patient's pain.  She will be discharged with a prescription for diclofenac to dose as directed.  She is encouraged to follow-up with primary provider for ongoing symptom management.  Work note is provided as requested. ____________________________________________  FINAL CLINICAL IMPRESSION(S) / ED DIAGNOSES  Final diagnoses:  Acute pain of right knee  Primary osteoarthritis of right knee  Lissa HoardMenshew, Thyra Yinger V Bacon, PA-C 07/23/17 0006    Merrily Brittleifenbark, Neil, MD 07/23/17 [phone]

## 2017-08-23 NOTE — Progress Notes (Signed)
Patient: Tammy Holt Female    DOB: [birth year]   62 y.o.   MRN: [id number] Visit Date: 08/24/2017  Today's Provider: Mila Merryonald Roanna Reaves, MD   Chief Complaint  Patient presents with  . Knee Pain   Subjective:    Knee Pain   The pain is present in the right knee.    Follow up ER visit  Patient was seen in ER for right knee pain and osteoarthritis of the right knee on 07/22/2017. Treatment for this included giving patient at prescription for Diclofenac. She reports good compliance with treatment, although she says the medication did not help to relieve the pain. She reports this condition is Worse. Patient states she still has constant pain in her right knee. She states pain worsens when standing or walking. She states she fell yesterday after her knee gave out on her.   ------------------------------------------------------------------------------------ She also states that escitalopram is not helping with depression. She never feels like getting out and doing anything. Feels anxious all the time.      Allergies  Allergen Reactions  . Latex     REACTION: Hives, Blisters     Current Outpatient Medications:  .  albuterol (PROAIR HFA) 108 (90 BASE) MCG/ACT inhaler, Inhale 1-2 puffs into the lungs 4 (four) times daily as needed. For wheezing, Disp: , Rfl:  .  escitalopram (LEXAPRO) 20 MG tablet, Take 1 tablet (20 mg total) by mouth daily., Disp: 30 tablet, Rfl: 5 .  lisinopril (PRINIVIL,ZESTRIL) 20 MG tablet, TAKE 1 TABLET BY MOUTH ONCE DAILY, Disp: 30 tablet, Rfl: 12 .  naproxen (NAPROSYN) 500 MG tablet, Take 1 tablet (500 mg total) by mouth 2 (two) times daily with a meal., Disp: 30 tablet, Rfl: 5  Review of Systems  Constitutional: Negative for appetite change, chills, fatigue and fever.  Respiratory: Negative for chest tightness and shortness of breath.   Cardiovascular: Negative for chest pain and palpitations.  Gastrointestinal: Negative for abdominal pain, nausea  and vomiting.  Musculoskeletal: Positive for arthralgias (right knee) and joint swelling (right knee). Gait problem: right knee.  Neurological: Negative for dizziness and weakness.    Social History   Tobacco Use  . Smoking status: Current Every Day Smoker    Packs/day: 1.00    Years: 40.00    Pack years: 40.00    Types: Cigarettes  . Smokeless tobacco: Never Used  . Tobacco comment: started at age 62 1/2-1 ppd  Substance Use Topics  . Alcohol use: Yes    Alcohol/week: 0.0 standard drinks    Comment: occasionally   Objective:   BP (!) 156/86 (BP Location: Left Arm, Cuff Size: Large)   Pulse 71   Temp 98.6 F (37 C) (Oral)   Resp 18   Wt 220 lb (99.8 kg)   SpO2 97% Comment: room air  BMI 37.76 kg/m  Vitals:   08/24/17 1454 08/24/17 1457  BP: (!) 158/80 (!) 156/86  Pulse: 71   Resp: 18   Temp: 98.6 F (37 C)   TempSrc: Oral   SpO2: 97%   Weight: 220 lb (99.8 kg)      Physical Exam   General Appearance:    Alert, cooperative, no distress  Eyes:    PERRL, conjunctiva/corneas clear, EOM's intact       Lungs:     Clear to auscultation bilaterally, respirations unlabored  Heart:    Regular rate and rhythm  Neurologic:   Awake, alert, oriented x 3. No apparent focal  neurological           defect.   MS:   Tender around anterior and medial joint line of right knee. Minimal swelling.        Assessment & Plan:     1. Chronic pain of right knee  - Ambulatory referral to Orthopedic Surgery - traMADol (ULTRAM) 50 MG tablet; Take 1 tablet (50 mg total) by mouth every 6 (six) hours as needed for moderate pain.  Dispense: 20 tablet; Refill: 0  2. Depression, unspecified depression type Change to - venlafaxine XR (EFFEXOR XR) 37.5 MG 24 hr capsule; One tablet daily for six days, then increase to 2 tablets daily  Dispense: 60 capsule; Refill: 0       Mila Merryonald Therman Hughlett, MD  [facility]

## 2017-08-24 ENCOUNTER — Ambulatory Visit (INDEPENDENT_AMBULATORY_CARE_PROVIDER_SITE_OTHER): Payer: Self-pay | Admitting: Family Medicine

## 2017-08-24 ENCOUNTER — Telehealth: Payer: Self-pay | Admitting: Family Medicine

## 2017-08-24 ENCOUNTER — Encounter: Payer: Self-pay | Admitting: Family Medicine

## 2017-08-24 VITALS — BP 156/86 | HR 71 | Temp 98.6°F | Resp 18 | Wt 220.0 lb

## 2017-08-24 DIAGNOSIS — G8929 Other chronic pain: Secondary | ICD-10-CM

## 2017-08-24 DIAGNOSIS — F329 Major depressive disorder, single episode, unspecified: Secondary | ICD-10-CM

## 2017-08-24 DIAGNOSIS — F32A Depression, unspecified: Secondary | ICD-10-CM

## 2017-08-24 DIAGNOSIS — M25561 Pain in right knee: Secondary | ICD-10-CM

## 2017-08-24 MED ORDER — TRAMADOL HCL 50 MG PO TABS: 50.0000 mg | ORAL_TABLET | Freq: Four times a day (QID) | ORAL | 0 refills | Status: DC | PRN

## 2017-08-24 MED ORDER — VENLAFAXINE HCL ER 37.5 MG PO CP24: ORAL_CAPSULE | ORAL | 0 refills | Status: DC

## 2017-08-24 NOTE — Patient Instructions (Signed)
   Stop escitalopram (Lexapro) when you start taking venlfaxine

## 2017-08-24 NOTE — Telephone Encounter (Signed)
Pt needs a note for a restriction to sit at work when her knees are hurting.   Please call her when the note is ready to be picked up  CB#  [phone]  Thanks teri

## 2017-08-24 NOTE — Telephone Encounter (Signed)
error 

## 2017-08-24 NOTE — Telephone Encounter (Signed)
Please advise 

## 2017-09-01 ENCOUNTER — Telehealth: Payer: Self-pay | Admitting: *Deleted

## 2017-09-01 NOTE — Telephone Encounter (Signed)
Advised pt letter she requested is ready to pick up. Patient will call back with a fax # to her job and would like us to fax letter for her.

## 2017-11-18 ENCOUNTER — Ambulatory Visit: Payer: Self-pay | Admitting: Family Medicine

## 2017-11-18 NOTE — Progress Notes (Deleted)
       Patient: Tammy Holt Female    DOB: 08-04-1955   62 y.o.   MRN: [id number] Visit Date: 11/18/2017  Today's Provider: Shirlee Latch, MD   No chief complaint on file.  Subjective:    I, Presley Raddle, CMA, am acting as a Neurosurgeon for Shirlee Latch, MD.   URI   Associated symptoms include congestion and coughing.       Allergies  Allergen Reactions  . Latex     REACTION: Hives, Blisters     Current Outpatient Medications:  .  albuterol (PROAIR HFA) 108 (90 BASE) MCG/ACT inhaler, Inhale 1-2 puffs into the lungs 4 (four) times daily as needed. For wheezing, Disp: , Rfl:  .  escitalopram (LEXAPRO) 20 MG tablet, Take 1 tablet (20 mg total) by mouth daily., Disp: 30 tablet, Rfl: 5 .  lisinopril (PRINIVIL,ZESTRIL) 20 MG tablet, TAKE 1 TABLET BY MOUTH ONCE DAILY, Disp: 30 tablet, Rfl: 12 .  naproxen (NAPROSYN) 500 MG tablet, Take 1 tablet (500 mg total) by mouth 2 (two) times daily with a meal., Disp: 30 tablet, Rfl: 5 .  traMADol (ULTRAM) 50 MG tablet, Take 1 tablet (50 mg total) by mouth every 6 (six) hours as needed for moderate pain., Disp: 20 tablet, Rfl: 0 .  venlafaxine XR (EFFEXOR XR) 37.5 MG 24 hr capsule, One tablet daily for six days, then increase to 2 tablets daily, Disp: 60 capsule, Rfl: 0  Review of Systems  Constitutional: Negative.   HENT: Positive for congestion.   Respiratory: Positive for cough.   Cardiovascular: Negative.   Musculoskeletal: Negative.     Social History   Tobacco Use  . Smoking status: Current Every Day Smoker    Packs/day: 1.00    Years: 40.00    Pack years: 40.00    Types: Cigarettes  . Smokeless tobacco: Never Used  . Tobacco comment: started at age 74 1/2-1 ppd  Substance Use Topics  . Alcohol use: Yes    Alcohol/week: 0.0 standard drinks    Comment: occasionally   Objective:   There were no vitals taken for this visit. There were no vitals filed for this visit.   Physical Exam      Assessment & Plan:          Shirlee Latch, MD  [facility]

## 2017-11-19 ENCOUNTER — Encounter: Payer: Self-pay | Admitting: Family Medicine

## 2017-11-19 ENCOUNTER — Ambulatory Visit (INDEPENDENT_AMBULATORY_CARE_PROVIDER_SITE_OTHER): Payer: Self-pay | Admitting: Family Medicine

## 2017-11-19 VITALS — BP 138/84 | HR 89 | Temp 97.8°F | Resp 18 | Wt 207.0 lb

## 2017-11-19 DIAGNOSIS — F17210 Nicotine dependence, cigarettes, uncomplicated: Secondary | ICD-10-CM

## 2017-11-19 DIAGNOSIS — J441 Chronic obstructive pulmonary disease with (acute) exacerbation: Secondary | ICD-10-CM

## 2017-11-19 MED ORDER — DOXYCYCLINE HYCLATE 50 MG PO CAPS: 50.0000 mg | ORAL_CAPSULE | Freq: Two times a day (BID) | ORAL | 0 refills | Status: AC

## 2017-11-19 NOTE — Progress Notes (Signed)
Patient: Tammy Holt Female    DOB: 1955-04-09   62 y.o.   MRN: [id number] Visit Date: 11/19/2017  Today's Provider: Megan Mans, MD   Chief Complaint  Patient presents with  . Cough    x 2 weeks   Subjective:    Cough  This is a new problem. Episode onset: 2 weeks. The problem has been gradually worsening. The cough is productive of sputum (with yellow sputum). Associated symptoms include chills, ear congestion, a fever, headaches, nasal congestion, postnasal drip, rhinorrhea, a sore throat, shortness of breath and wheezing. Pertinent negatives include no chest pain, ear pain or hemoptysis. Treatments tried: Tylenol cold and Flu. The treatment provided no relief.       Allergies  Allergen Reactions  . Latex     REACTION: Hives, Blisters     Current Outpatient Medications:  .  albuterol (PROAIR HFA) 108 (90 BASE) MCG/ACT inhaler, Inhale 1-2 puffs into the lungs 4 (four) times daily as needed. For wheezing, Disp: , Rfl:  .  lisinopril (PRINIVIL,ZESTRIL) 20 MG tablet, TAKE 1 TABLET BY MOUTH ONCE DAILY, Disp: 30 tablet, Rfl: 12 .  escitalopram (LEXAPRO) 20 MG tablet, Take 1 tablet (20 mg total) by mouth daily. (Patient not taking: Reported on 11/19/2017), Disp: 30 tablet, Rfl: 5 .  naproxen (NAPROSYN) 500 MG tablet, Take 1 tablet (500 mg total) by mouth 2 (two) times daily with a meal. (Patient not taking: Reported on 11/19/2017), Disp: 30 tablet, Rfl: 5 .  traMADol (ULTRAM) 50 MG tablet, Take 1 tablet (50 mg total) by mouth every 6 (six) hours as needed for moderate pain. (Patient not taking: Reported on 11/19/2017), Disp: 20 tablet, Rfl: 0 .  venlafaxine XR (EFFEXOR XR) 37.5 MG 24 hr capsule, One tablet daily for six days, then increase to 2 tablets daily (Patient not taking: Reported on 11/19/2017), Disp: 60 capsule, Rfl: 0  Review of Systems  Constitutional: Positive for chills, fatigue and fever. Negative for appetite change.  HENT: Positive for congestion,  postnasal drip, rhinorrhea, sinus pressure and sore throat. Negative for ear pain.   Respiratory: Positive for cough, shortness of breath and wheezing. Negative for hemoptysis and chest tightness.   Cardiovascular: Negative for chest pain and palpitations.  Gastrointestinal: Negative for abdominal pain, nausea and vomiting.  Neurological: Positive for headaches. Negative for dizziness and weakness.  Psychiatric/Behavioral: Negative.     Social History   Tobacco Use  . Smoking status: Current Every Day Smoker    Packs/day: 1.00    Years: 40.00    Pack years: 40.00    Types: Cigarettes  . Smokeless tobacco: Never Used  . Tobacco comment: started at age 71 1/2-1 ppd  Substance Use Topics  . Alcohol use: Yes    Alcohol/week: 0.0 standard drinks    Comment: occasionally   Objective:   BP 138/84 (BP Location: Left Arm, Patient Position: Sitting, Cuff Size: Large)   Pulse 89   Temp 97.8 F (36.6 C) (Oral)   Resp 18   Wt 207 lb (93.9 kg)   SpO2 96% Comment: room air  BMI 35.53 kg/m  Vitals:   11/19/17 0909  BP: 138/84  Pulse: 89  Resp: 18  Temp: 97.8 F (36.6 C)  TempSrc: Oral  SpO2: 96%  Weight: 207 lb (93.9 kg)     Physical Exam  Constitutional: She is oriented to person, place, and time. She appears well-developed and well-nourished.  HENT:  Head: Normocephalic and atraumatic.  Right Ear: External ear normal.  Left Ear: External ear normal.  Nose: Nose normal.  Eyes: Conjunctivae are normal. No scleral icterus.  Neck: No thyromegaly present.  Cardiovascular: Normal rate, regular rhythm and normal heart sounds.  Pulmonary/Chest: Effort normal and breath sounds normal. No respiratory distress. She has no wheezes.  Occasional rhonchi.  Abdominal: Soft.  Musculoskeletal: She exhibits no edema.  Neurological: She is alert and oriented to person, place, and time.  Skin: Skin is warm and dry.  Psychiatric: She has a normal mood and affect. Her behavior is normal.  Judgment and thought content normal.        Assessment & Plan:     1. COPD exacerbation (HCC)  - doxycycline (VIBRAMYCIN) 50 MG capsule; Take 1 capsule (50 mg total) by mouth 2 (two) times daily for 10 days.  Dispense: 20 capsule; Refill: 0 2.Tobacco abuse Stop smoking advised.       Wendelyn Breslow, MD  [facility]

## 2017-11-19 NOTE — Patient Instructions (Signed)
Stop smoking. Also recommend OTC Robitussin for cough  Follow up with Dr. Sherrie Mustache in 2-3 weeks.

## 2017-12-01 ENCOUNTER — Ambulatory Visit: Payer: Self-pay | Admitting: Family Medicine

## 2018-01-17 ENCOUNTER — Other Ambulatory Visit: Payer: Self-pay | Admitting: Family Medicine

## 2018-01-17 DIAGNOSIS — F32A Depression, unspecified: Secondary | ICD-10-CM

## 2018-01-17 DIAGNOSIS — I10 Essential (primary) hypertension: Secondary | ICD-10-CM

## 2018-01-17 DIAGNOSIS — F329 Major depressive disorder, single episode, unspecified: Secondary | ICD-10-CM

## 2018-01-17 MED ORDER — ESCITALOPRAM OXALATE 20 MG PO TABS: 20.0000 mg | ORAL_TABLET | Freq: Every day | ORAL | 12 refills | Status: DC

## 2018-01-17 MED ORDER — LISINOPRIL 20 MG PO TABS: 20.0000 mg | ORAL_TABLET | Freq: Every day | ORAL | 12 refills | Status: DC

## 2018-01-17 NOTE — Telephone Encounter (Signed)
Pt needing refills on:  escitalopram (LEXAPRO) 20 MG tablet lisinopril (PRINIVIL,ZESTRIL) 20 MG tablet  Please fill at:  [facility] [address] [place] - [address] [phone] (Phone) [phone] (Fax)   Thanks, [facility]

## 2018-08-15 IMAGING — DX DG KNEE COMPLETE 4+V*R*
4 series · 4 of 4 positions shown · non-contrast
Comparison: None.

CLINICAL DATA: Right knee pain and swelling after recent falls.

EXAM:
RIGHT KNEE - COMPLETE 4+ VIEW

[knee ap]
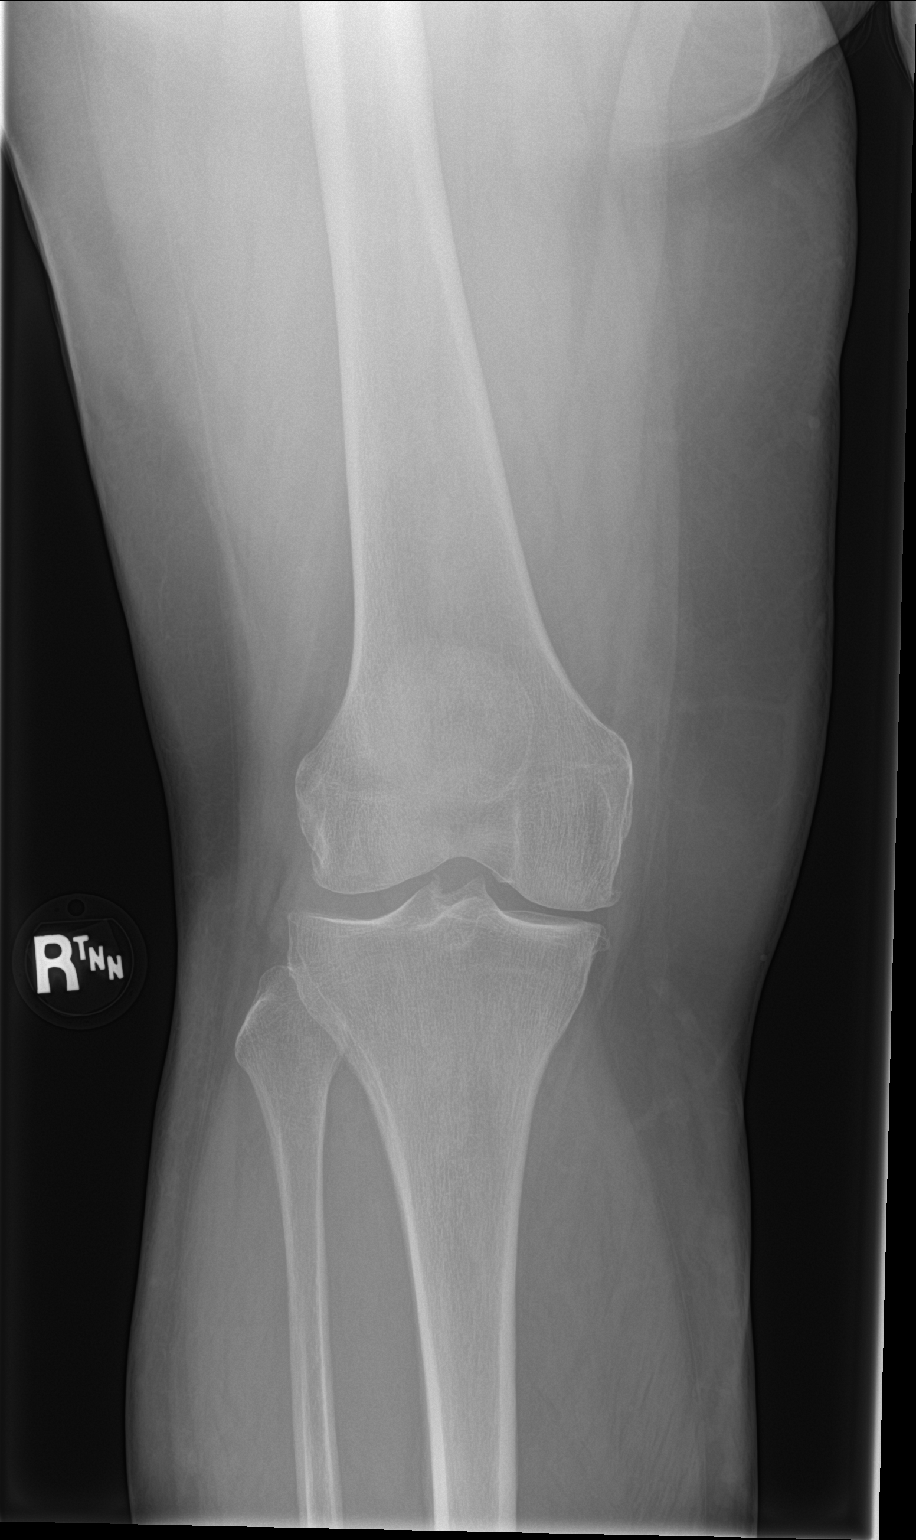

[knee obl (1 of 2)]
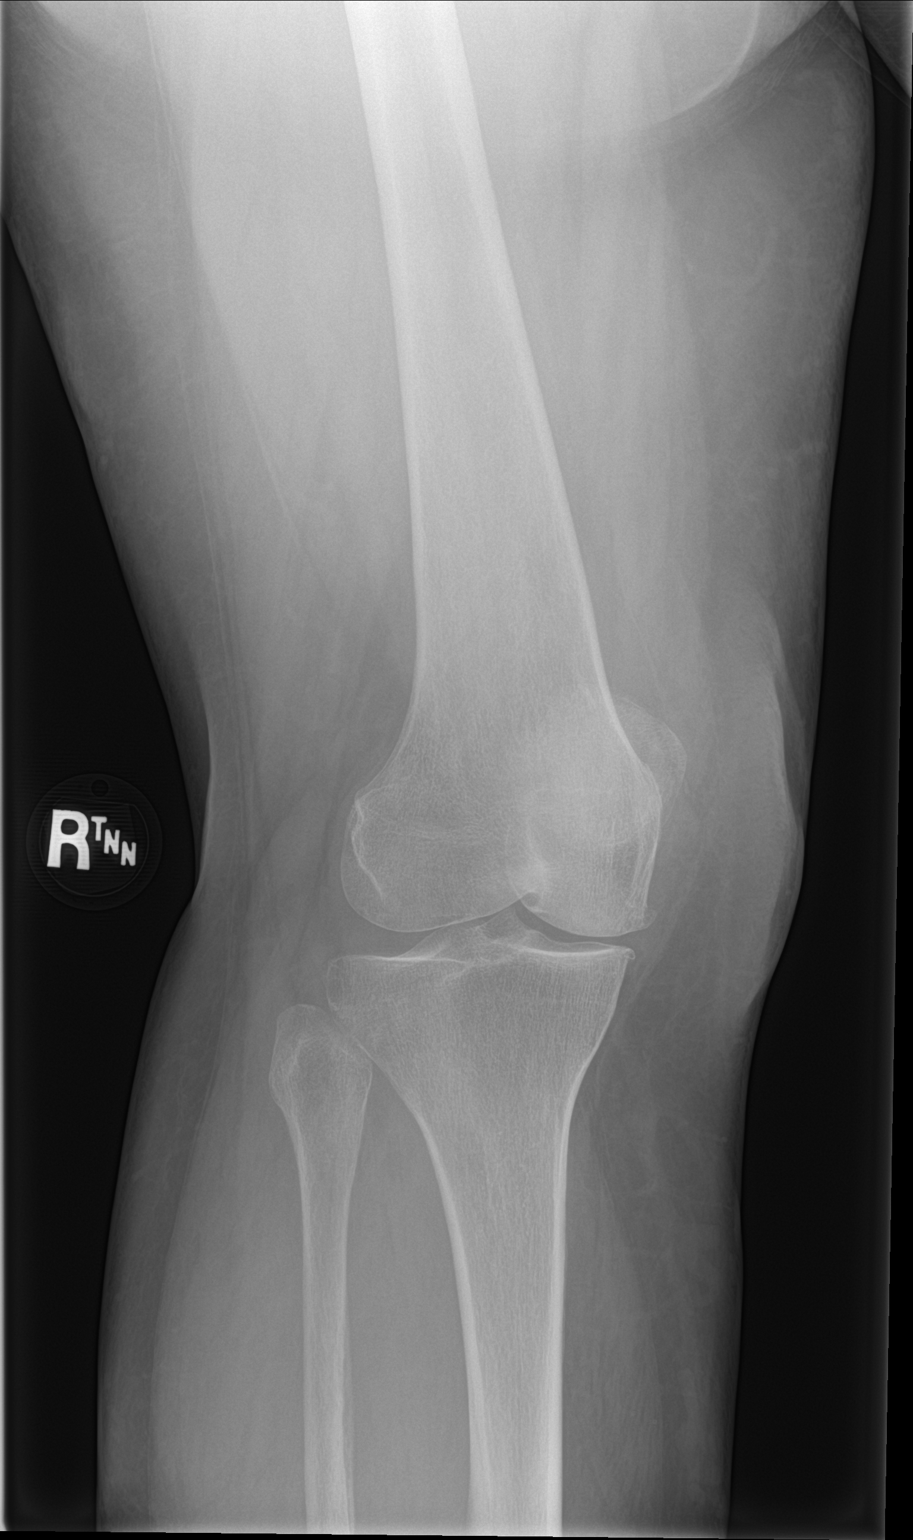

[knee obl (2 of 2)]
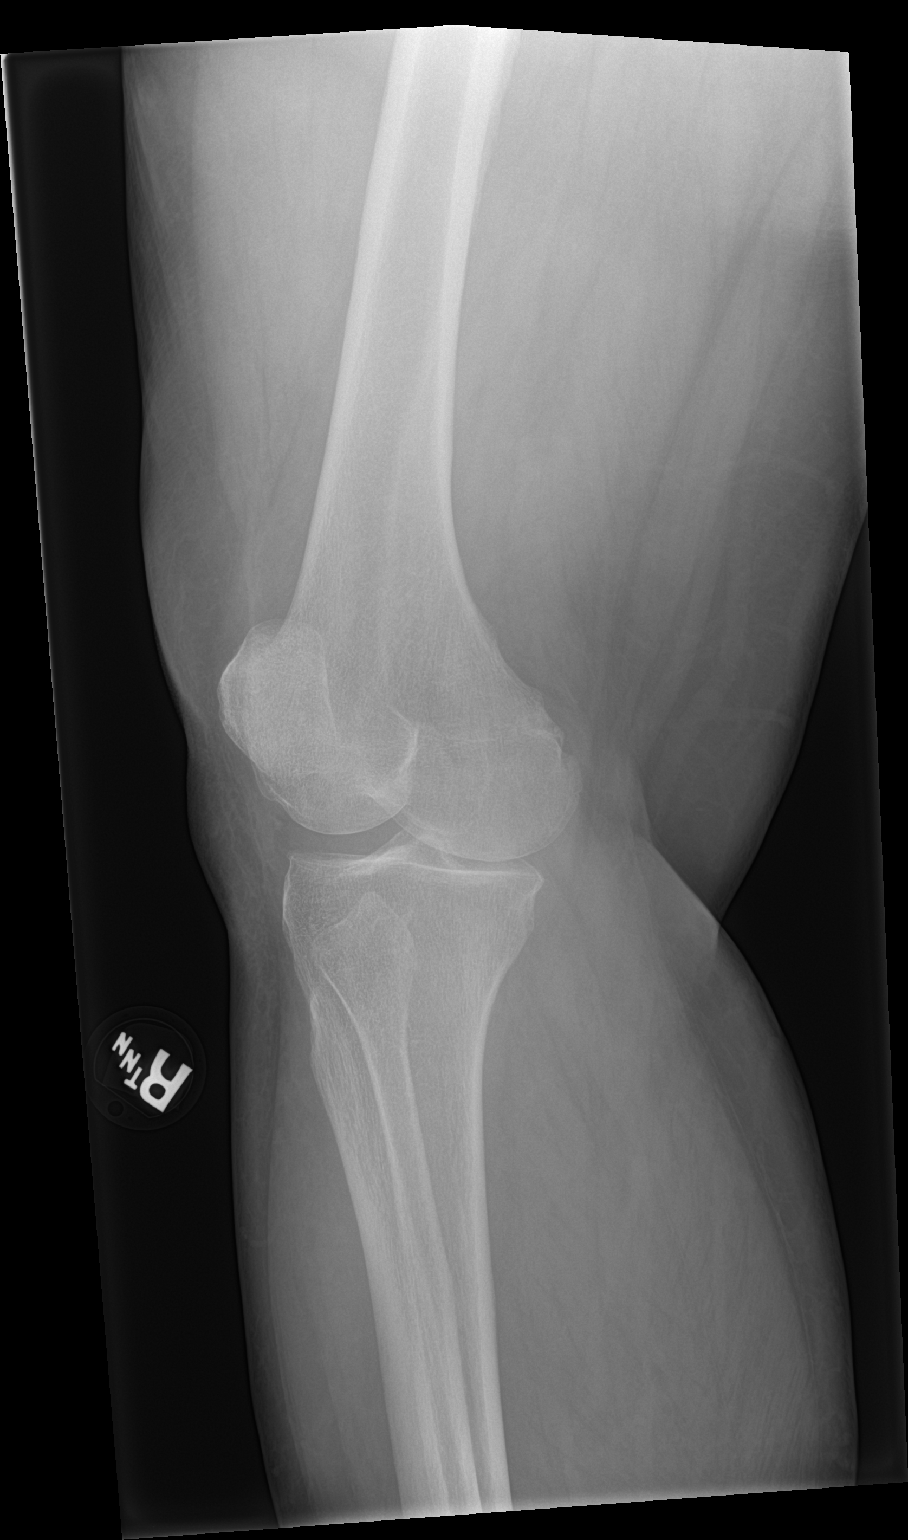

[knee lat]
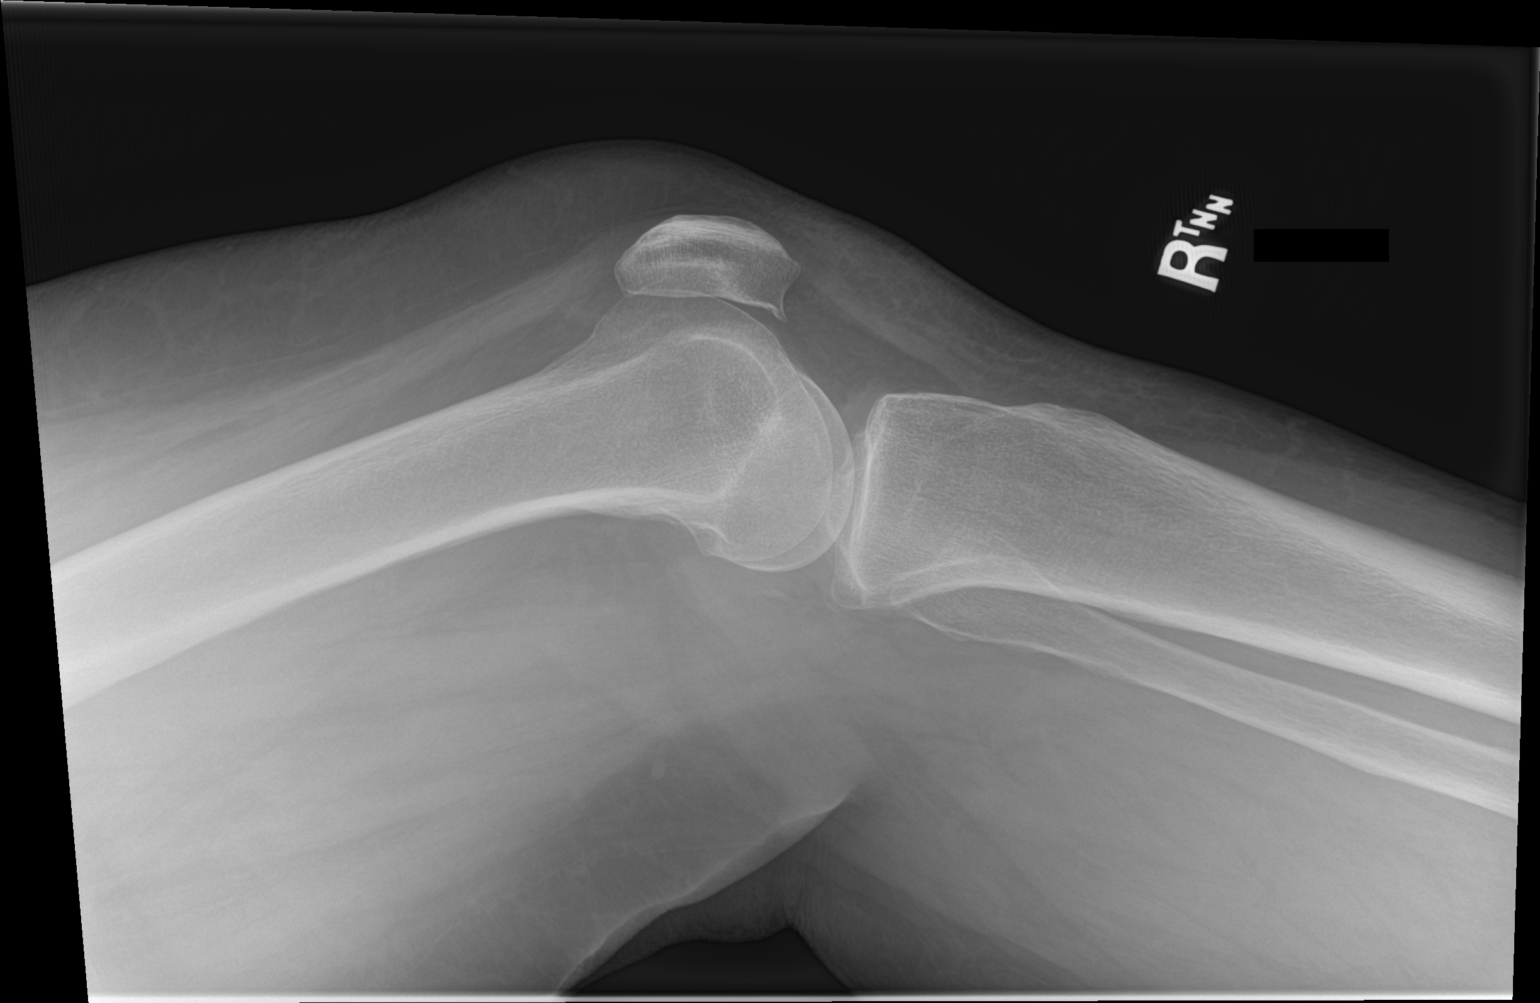

[4 of 4 positions shown; findings below may reference images not displayed]

FINDINGS: No evidence of fracture, dislocation or joint effusion. Ordinary
osteoarthritis, more pronounced in the medial compartment than the
patellofemoral joint.
IMPRESSION: No acute or traumatic finding. Osteoarthritis most pronounced in the
medial compartment.

## 2018-10-14 ENCOUNTER — Emergency Department: Payer: Self-pay

## 2018-10-14 ENCOUNTER — Other Ambulatory Visit: Payer: Self-pay

## 2018-10-14 ENCOUNTER — Emergency Department
Admission: EM | Admit: 2018-10-14 | Discharge: 2018-10-14 | Disposition: A | Discharge status: Home / Self Care | Payer: Self-pay | Attending: Emergency Medicine | Admitting: Emergency Medicine

## 2018-10-14 ENCOUNTER — Encounter: Payer: Self-pay | Admitting: Emergency Medicine

## 2018-10-14 DIAGNOSIS — E119 Type 2 diabetes mellitus without complications: Secondary | ICD-10-CM | POA: Insufficient documentation

## 2018-10-14 DIAGNOSIS — Z91040 Latex allergy status: Secondary | ICD-10-CM | POA: Insufficient documentation

## 2018-10-14 DIAGNOSIS — G8929 Other chronic pain: Secondary | ICD-10-CM | POA: Insufficient documentation

## 2018-10-14 DIAGNOSIS — I10 Essential (primary) hypertension: Secondary | ICD-10-CM | POA: Insufficient documentation

## 2018-10-14 DIAGNOSIS — Z79899 Other long term (current) drug therapy: Secondary | ICD-10-CM | POA: Insufficient documentation

## 2018-10-14 DIAGNOSIS — F17210 Nicotine dependence, cigarettes, uncomplicated: Secondary | ICD-10-CM | POA: Insufficient documentation

## 2018-10-14 DIAGNOSIS — J45909 Unspecified asthma, uncomplicated: Secondary | ICD-10-CM | POA: Insufficient documentation

## 2018-10-14 DIAGNOSIS — M1711 Unilateral primary osteoarthritis, right knee: Secondary | ICD-10-CM | POA: Insufficient documentation

## 2018-10-14 MED ORDER — DICLOFENAC SODIUM 75 MG PO TBEC: 75.0000 mg | DELAYED_RELEASE_TABLET | Freq: Two times a day (BID) | ORAL | 1 refills | Status: AC

## 2018-10-14 MED ORDER — CYCLOBENZAPRINE HCL 5 MG PO TABS: 5.0000 mg | ORAL_TABLET | Freq: Every evening | ORAL | 0 refills | Status: AC | PRN

## 2018-10-14 NOTE — Discharge Instructions (Addendum)
You exam is consistent with osteoarthritis. Take the prescription meds as directed. Follow-up with [facility] or [facility] for ongoing treatment.

## 2018-10-14 NOTE — ED Provider Notes (Signed)
[facility] Emergency Department Provider Note ____________________________________________  Time seen: 1359  I have reviewed the triage vital signs and the nursing notes.  HISTORY  Chief Complaint  Knee Pain  HPI Tammy Holt is a 63 y.o. female presents herself to the ED for evaluation of chronic right knee pain.  Patient confirms her previous diagnosis of osteoarthritis and rheumatoid arthritis from previous visits.  She apparently was seen recently at [facility], and after x-rays were reported as negative, she was referred to the orthopedic clinic.  She is uninsured currently, and recall she does not have a $200 co-pay required for the visit.  She has been seen by me in the past for similar complaints and the diagnosis is consistent.  She denies any preceding the injury, accident, or trauma.  She reports pain to the right knee consistent with her medial compartment osteoarthritis.  She has been taking naproxen for pain relief.  Past Medical History:  Diagnosis Date  . Asthma   . Diabetes mellitus without complication (HCC)   . GERD (gastroesophageal reflux disease)   . History of chicken pox   . Hyperlipidemia   . Hypertension   . Panic disorder     Patient Active Problem List   Diagnosis Date Noted  . Depression 05/10/2017  . COPD exacerbation (HCC) 02/05/2017  . 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 GERD (gastroesophageal reflux disease) 09/23/2014  . Hematuria 09/23/2014  . Incisional hernia 09/23/2014  . History of cervical cancer 09/23/2014  . History of gastric ulcer 09/23/2014  . Menopausal symptoms 09/23/2014  . Proteinuria 09/23/2014  . Tension type headache 09/23/2014  . Urinary incontinence 09/23/2014  . Insomnia 09/23/2014  . Acute anxiety 11/25/2009  . Essential hypertension 11/25/2009  . Cyst of ovary 02/09/2009  . Internal hemorrhoids without complication 01/15/2009  . History of colon polyps 12/11/2008  .  External hemorrhoids without complication 10/29/2008  . Asthma without status asthmaticus 05/14/2008  . Allergic rhinitis 04/11/2007  . Smoking greater than 30 pack years 04/11/2007  . Panic disorder 04/11/2007  . Pure hypercholesterolemia 04/11/2007  . Overflow incontinence 04/11/2007    Past Surgical History:  Procedure Laterality Date  . ABDOMINAL HYSTERECTOMY  1990   Vaginal. Partial. Due to cervical cancer. One ovary resected + right cyst  . Abdominal Ultrasound  2011   large incisional hernia and ovarian cyst  . APPENDECTOMY    . CHOLECYSTECTOMY  1990  . COLECTOMY  03/2009   partial right colectomy for multiple polyps- all beninign (incidental appendectomy)  . COLONOSCOPY  2010   multiple colon polyps with single large polyp, internal hemorrhoids. Dr. Niel Hummer. Referral to [facility] surgery for resection  . HERNIA REPAIR  09/2010   Incisional hernia repair    Prior to Admission medications   Medication Sig Start Date End Date Taking? Authorizing Provider  albuterol (PROAIR HFA) 108 (90 BASE) MCG/ACT inhaler Inhale 1-2 puffs into the lungs 4 (four) times daily as needed. For wheezing 01/18/11   [provider]  cyclobenzaprine (FLEXERIL) 5 MG tablet Take 1 tablet (5 mg total) by mouth at bedtime as needed. 10/14/18 11/13/18  Fredrick Dray, Charlesetta Ivory, PA-C  diclofenac (VOLTAREN) 75 MG EC tablet Take 1 tablet (75 mg total) by mouth 2 (two) times daily. 10/14/18 12/13/18  Ellora Varnum, Charlesetta Ivory, PA-C  escitalopram (LEXAPRO) 20 MG tablet Take 1 tablet (20 mg total) by mouth daily. 01/17/18   [name], MD  lisinopril (PRINIVIL,ZESTRIL) 20 MG tablet Take  1 tablet (20 mg total) by mouth daily. 01/17/18   Malva LimesFisher, Donald E, MD  venlafaxine XR (EFFEXOR XR) 37.5 MG 24 hr capsule One tablet daily for six days, then increase to 2 tablets daily Patient not taking: Reported on 11/19/2017 08/24/17   Malva LimesFisher, Donald E, MD    Allergies Latex  Family History  Problem Relation Age of  Onset  . Diabetes Mother   . Stroke Mother   . Depression Mother   . Alcohol abuse Father   . Cirrhosis Father        of the liver  . Colon cancer Sister   . Anxiety disorder Sister   . Lupus Sister   . COPD Sister     Social History Social History   Tobacco Use  . Smoking status: Current Every Day Smoker    Packs/day: 1.00    Years: 40.00    Pack years: 40.00    Types: Cigarettes  . Smokeless tobacco: Never Used  . Tobacco comment: started at age 63 1/2-1 ppd  Substance Use Topics  . Alcohol use: Yes    Alcohol/week: 0.0 standard drinks    Comment: occasionally  . Drug use: No    Review of Systems  Constitutional: Negative for fever. Cardiovascular: Negative for chest pain. Respiratory: Negative for shortness of breath. Musculoskeletal: Negative for back pain.  Right knee pain as above. Skin: Negative for rash. Neurological: Negative for headaches, focal weakness or numbness. ____________________________________________  PHYSICAL EXAM:  VITAL SIGNS: ED Triage Vitals  Enc Vitals Group     BP 10/14/18 1248 (!) 147/73     Pulse Rate 10/14/18 1248 77     Resp 10/14/18 1248 15     Temp 10/14/18 1248 98.2 F (36.8 C)     Temp Source 10/14/18 1248 Oral     SpO2 10/14/18 1248 96 %     Weight 10/14/18 1248 200 lb (90.7 kg)     Height 10/14/18 1248 5' (1.524 m)     Head Circumference --      Peak Flow --      Pain Score 10/14/18 1252 6     Pain Loc --      Pain Edu? --      Excl. in GC? --     Constitutional: Alert and oriented. Well appearing and in no distress. Head: Normocephalic and atraumatic. Eyes: Conjunctivae are normal. Normal extraocular movements Cardiovascular: Normal rate, regular rhythm. Normal distal pulses. Respiratory: Normal respiratory effort. Musculoskeletal: Right knee without obvious deformity, dislocation, or joint effusion.  Patient with normal flexion extension range of the right knee.  She is able to demonstrate normal quad muscle  strength without deficit.  No popliteal space fullness is noted.  No calf or Achilles tenderness is elicited.  Patient with no valgus or varus joint stress appreciated.  Nontender with normal range of motion in all extremities.  Neurologic:  Normal speech and language. No gross focal neurologic deficits are appreciated. Skin:  Skin is warm, dry and intact. No rash noted. Psychiatric: Mood and affect are normal. Patient exhibits appropriate insight and judgment. ____________________________________________   RADIOLOGY  deferred ____________________________________________  PROCEDURES  Procedures ____________________________________________  INITIAL IMPRESSION / ASSESSMENT AND PLAN / ED COURSE  Patient with ED evaluation of chronic right knee pain secondary to history of underlying osteoarthritis.  Exam is overall benign reassuring at this time.  Patient is requesting a work note for 2 days and to change her medications.  She will be placed on diclofenac  daily as well as as needed Flexeril for osteoarthritis.  She is again referred to orthopedics at this time, and an application copy for [place] care is provided for her benefit.  She will follow-up as directed.  DONALDA JOB was evaluated in Emergency Department on 10/14/2018 for the symptoms described in the history of present illness. She was evaluated in the context of the global COVID-19 pandemic, which necessitated consideration that the patient might be at risk for infection with the SARS-CoV-2 virus that causes COVID-19. Institutional protocols and algorithms that pertain to the evaluation of patients at risk for COVID-19 are in a state of rapid change based on information released by regulatory bodies including the CDC and federal and state organizations. These policies and algorithms were followed during the patient's care in the ED. ____________________________________________  FINAL CLINICAL IMPRESSION(S) / ED  DIAGNOSES  Final diagnoses:  Primary osteoarthritis of right knee  Chronic pain of right knee      Carmie End, Dannielle Karvonen, PA-C 10/14/18 1426    Vanessa Akron, MD 10/14/18 1444

## 2018-10-14 NOTE — ED Triage Notes (Addendum)
Pt c/o chronic knee pain, states she was diagnosed with bone spurs and RA at [facility].  Pt states that her  R knee gives out at times. Pt does use a walking stick to assist with ambulation.  Pt states she has tried a brace to her knee, but states the pressure hurts too bad.  Pt is taking ibuprofen for pain at home.

## 2018-12-17 ENCOUNTER — Emergency Department: Payer: Self-pay

## 2018-12-17 ENCOUNTER — Other Ambulatory Visit: Payer: Self-pay

## 2018-12-17 ENCOUNTER — Encounter: Payer: Self-pay | Admitting: Emergency Medicine

## 2018-12-17 ENCOUNTER — Emergency Department
Admission: EM | Admit: 2018-12-17 | Discharge: 2018-12-17 | Disposition: A | Discharge status: Home / Self Care | Payer: Self-pay | Attending: Emergency Medicine | Admitting: Emergency Medicine

## 2018-12-17 DIAGNOSIS — J209 Acute bronchitis, unspecified: Secondary | ICD-10-CM | POA: Insufficient documentation

## 2018-12-17 DIAGNOSIS — J449 Chronic obstructive pulmonary disease, unspecified: Secondary | ICD-10-CM | POA: Insufficient documentation

## 2018-12-17 DIAGNOSIS — Z79899 Other long term (current) drug therapy: Secondary | ICD-10-CM | POA: Insufficient documentation

## 2018-12-17 DIAGNOSIS — Z8541 Personal history of malignant neoplasm of cervix uteri: Secondary | ICD-10-CM | POA: Insufficient documentation

## 2018-12-17 DIAGNOSIS — Z20822 Contact with and (suspected) exposure to covid-19: Secondary | ICD-10-CM

## 2018-12-17 DIAGNOSIS — Z20828 Contact with and (suspected) exposure to other viral communicable diseases: Secondary | ICD-10-CM | POA: Insufficient documentation

## 2018-12-17 DIAGNOSIS — F17210 Nicotine dependence, cigarettes, uncomplicated: Secondary | ICD-10-CM | POA: Insufficient documentation

## 2018-12-17 DIAGNOSIS — Z91040 Latex allergy status: Secondary | ICD-10-CM | POA: Insufficient documentation

## 2018-12-17 DIAGNOSIS — I10 Essential (primary) hypertension: Secondary | ICD-10-CM | POA: Insufficient documentation

## 2018-12-17 DIAGNOSIS — E119 Type 2 diabetes mellitus without complications: Secondary | ICD-10-CM | POA: Insufficient documentation

## 2018-12-17 LAB — POC SARS CORONAVIRUS 2 AG: SARS Coronavirus 2 Ag: NEGATIVE

## 2018-12-17 LAB — SARS CORONAVIRUS 2 (TAT 6-24 HRS): SARS Coronavirus 2: NEGATIVE

## 2018-12-17 MED ORDER — PREDNISONE 10 MG (21) PO TBPK: ORAL_TABLET | ORAL | 0 refills | Status: DC

## 2018-12-17 MED ORDER — BENZONATATE 200 MG PO CAPS: 200.0000 mg | ORAL_CAPSULE | Freq: Three times a day (TID) | ORAL | 0 refills | Status: DC | PRN

## 2018-12-17 MED ORDER — AMOXICILLIN 500 MG PO CAPS: 500.0000 mg | ORAL_CAPSULE | Freq: Three times a day (TID) | ORAL | 0 refills | Status: DC

## 2018-12-17 NOTE — Discharge Instructions (Signed)
Follow-up with your regular doctor if not improving in 3 to 4 days.  Your Covid test should result in 6 to 24 hours.  Nurse will call you with results.  He should remain quarantined until you have received your results.  If positive he should remain quarantined for an additional 10 days.  If negative you may return to work the next day.

## 2018-12-17 NOTE — ED Triage Notes (Signed)
Pt arrived via POV with reports of cough that is productive phlegm x 3 days, pt states feels like bronchitis.  Pt states "No I don't have COVID-19".    Pt reports she works at [employer] and had to leave work because of the cough-pt states she needs a work note.

## 2018-12-17 NOTE — ED Notes (Signed)
Pt verbalized understanding of discharge instructions. NAD at this time. 

## 2018-12-17 NOTE — ED Provider Notes (Signed)
[facility] Emergency Department Provider Note  ____________________________________________   First MD Initiated Contact with Patient 12/17/18 1130     (approximate)  I have reviewed the triage vital signs and the nursing notes.   HISTORY  Chief Complaint Cough    HPI Tammy Holt is a 63 y.o. female presents emergency department complaint of cough that is productive with white phlegm for 3 days.  She states to feel like I have bronchitis.  However the patient does work the window at [employer]  States she is been running a lower temperature than her normal.  She denies chest pain or shortness of breath.  No swelling in the extremities.  She retains her sense of taste and smell at this time.    Past Medical History:  Diagnosis Date  . Asthma   . Diabetes mellitus without complication ([place])   . GERD (gastroesophageal reflux disease)   . History of chicken pox   . Hyperlipidemia   . Hypertension   . Panic disorder     Patient Active Problem List   Diagnosis Date Noted  . Depression 05/10/2017  . COPD exacerbation ([place]) 02/05/2017  . Diabetes mellitus with nephropathy ([place]) 09/23/2014  . GERD (gastroesophageal reflux disease) 09/23/2014  . Hematuria 09/23/2014  . Incisional hernia 09/23/2014  . History of cervical cancer 09/23/2014  . History of gastric ulcer 09/23/2014  . Menopausal symptoms 09/23/2014  . Proteinuria 09/23/2014  . Tension type headache 09/23/2014  . Urinary incontinence 09/23/2014  . Insomnia 09/23/2014  . Acute anxiety 11/25/2009  . Essential hypertension 11/25/2009  . Cyst of ovary 02/09/2009  . Internal hemorrhoids without complication 40/98/1191  . History of colon polyps 12/11/2008  . External hemorrhoids without complication 47/82/9562  . Asthma without status asthmaticus 05/14/2008  . Allergic rhinitis 04/11/2007  . Smoking greater than 30 pack years 04/11/2007  . Panic disorder 04/11/2007  .  Pure hypercholesterolemia 04/11/2007  . Overflow incontinence 04/11/2007    Past Surgical History:  Procedure Laterality Date  . ABDOMINAL HYSTERECTOMY  1990   Vaginal. Partial. Due to cervical cancer. One ovary resected + right cyst  . Abdominal Ultrasound  2011   large incisional hernia and ovarian cyst  . APPENDECTOMY    . CHOLECYSTECTOMY  1990  . COLECTOMY  03/2009   partial right colectomy for multiple polyps- all beninign (incidental appendectomy)  . COLONOSCOPY  2010   multiple colon polyps with single large polyp, internal hemorrhoids. Dr. Dionne Milo. Referral to [facility] surgery for resection  . HERNIA REPAIR  09/2010   Incisional hernia repair    Prior to Admission medications   Medication Sig Start Date End Date Taking? Authorizing Provider  albuterol (PROAIR HFA) 108 (90 BASE) MCG/ACT inhaler Inhale 1-2 puffs into the lungs 4 (four) times daily as needed. For wheezing 01/18/11   [provider]  amoxicillin (AMOXIL) 500 MG capsule Take 1 capsule (500 mg total) by mouth 3 (three) times daily. 12/17/18   Fisher, Linden Dolin, PA-C  benzonatate (TESSALON) 200 MG capsule Take 1 capsule (200 mg total) by mouth 3 (three) times daily as needed for cough. 12/17/18   Fisher, Linden Dolin, PA-C  escitalopram (LEXAPRO) 20 MG tablet Take 1 tablet (20 mg total) by mouth daily. 01/17/18   Birdie Sons, MD  lisinopril (PRINIVIL,ZESTRIL) 20 MG tablet Take 1 tablet (20 mg total) by mouth daily. 01/17/18   Birdie Sons, MD  predniSONE (STERAPRED UNI-PAK 21 TAB) 10 MG (21) TBPK tablet Take  6 pills on day one then decrease by 1 pill each day 12/17/18   Faythe GheeFisher, Susan W, PA-C  venlafaxine XR (EFFEXOR XR) 37.5 MG 24 hr capsule One tablet daily for six days, then increase to 2 tablets daily Patient not taking: Reported on 11/19/2017 08/24/17   Malva LimesFisher, Donald E, MD    Allergies Latex  Family History  Problem Relation Age of Onset  . Diabetes Mother   . Stroke Mother   . Depression Mother   .  Alcohol abuse Father   . Cirrhosis Father        of the liver  . Colon cancer Sister   . Anxiety disorder Sister   . Lupus Sister   . COPD Sister     Social History Social History   Tobacco Use  . Smoking status: Current Every Day Smoker    Packs/day: 1.00    Years: 40.00    Pack years: 40.00    Types: Cigarettes  . Smokeless tobacco: Never Used  . Tobacco comment: started at age 63 1/2-1 ppd  Substance Use Topics  . Alcohol use: Yes    Alcohol/week: 0.0 standard drinks    Comment: occasionally  . Drug use: No    Review of Systems  Constitutional: Unsure fever/chills Eyes: No visual changes. ENT: No sore throat. Respiratory: Positive cough Genitourinary: Negative for dysuria. Musculoskeletal: Negative for back pain. Skin: Negative for rash.    ____________________________________________   PHYSICAL EXAM:  VITAL SIGNS: ED Triage Vitals  Enc Vitals Group     BP 12/17/18 1102 (!) 168/79     Pulse Rate 12/17/18 1102 80     Resp 12/17/18 1102 16     Temp 12/17/18 1102 98 F (36.7 C)     Temp Source 12/17/18 1102 Oral     SpO2 12/17/18 1102 98 %     Weight 12/17/18 1107 200 lb (90.7 kg)     Height 12/17/18 1107 5\' 2"  (1.575 m)     Head Circumference --      Peak Flow --      Pain Score 12/17/18 1107 8     Pain Loc --      Pain Edu? --      Excl. in GC? --     Constitutional: Alert and oriented. Well appearing and in no acute distress. Eyes: Conjunctivae are normal.  Head: Atraumatic. Nose: No congestion/rhinnorhea. Mouth/Throat: Mucous membranes are moist.   Neck:  supple no lymphadenopathy noted Cardiovascular: Normal rate, regular rhythm. Heart sounds are normal Respiratory: Normal respiratory effort.  No retractions, lungs c t a, cough is harsh GU: deferred Musculoskeletal: FROM all extremities, warm and well perfused Neurologic:  Normal speech and language.  Skin:  Skin is warm, dry and intact. No rash noted. Psychiatric: Mood and affect are  normal. Speech and behavior are normal.  ____________________________________________   LABS (all labs ordered are listed, but only abnormal results are displayed)  Labs Reviewed  SARS CORONAVIRUS 2 (TAT 6-24 HRS)  POC SARS CORONAVIRUS 2 AG -  ED  POC SARS CORONAVIRUS 2 AG   ____________________________________________   ____________________________________________  RADIOLOGY  Chest x-ray is negative  ____________________________________________   PROCEDURES  Procedure(s) performed: No  Procedures    ____________________________________________   INITIAL IMPRESSION / ASSESSMENT AND PLAN / ED COURSE  Pertinent labs & imaging results that were available during my care of the patient were reviewed by me and considered in my medical decision making (see chart for details).   Patient  63 year old female presents emergency department with a history of diabetes, COPD, etc.  Patient is complaining of a cough for 2 to 3 days.  States it feels like bronchitis.  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  Physical exam shows patient to appear well.  Afebrile upon arrival but patient has taken OTC Tylenol.  Cough is harsh.  Chest x-ray ordered Covid test  ----------------------------------------- 12:58 PM on 12/17/2018 -----------------------------------------  Patient still appears well.  Explained her chest x-ray is negative and her quick Covid test is negative.  Explained to her that that test is approximately 85% accurate so we will perform a send out test.  She is to remain quarantined until she receives the results.  If negative she can return to work next day.  If positive she is to remain quarantined for additional 10 days.  She was given a prescription for amoxicillin and Sterapred along with [employer].  She is discharged in stable condition.   Tammy Holt was evaluated in Emergency Department on  12/17/2018 for the symptoms described in the history of present illness. She was evaluated in the context of the global COVID-19 pandemic, which necessitated consideration that the patient might be at risk for infection with the SARS-CoV-2 virus that causes COVID-19. Institutional protocols and algorithms that pertain to the evaluation of patients at risk for COVID-19 are in a state of rapid change based on information released by regulatory bodies including the CDC and federal and state organizations. These policies and algorithms were followed during the patient's care in the ED.   As part of my medical decision making, I reviewed the following data within the electronic MEDICAL RECORD NUMBER Nursing notes reviewed and incorporated, Labs reviewed , Old chart reviewed, Radiograph reviewed , Notes from prior ED visits and [place] Controlled Substance Database  ____________________________________________   FINAL CLINICAL IMPRESSION(S) / ED DIAGNOSES  Final diagnoses:  Acute bronchitis, unspecified organism  Suspected COVID-19 virus infection      NEW MEDICATIONS STARTED DURING THIS VISIT:  New Prescriptions   AMOXICILLIN (AMOXIL) 500 MG CAPSULE    Take 1 capsule (500 mg total) by mouth 3 (three) times daily.   BENZONATATE (TESSALON) 200 MG CAPSULE    Take 1 capsule (200 mg total) by mouth 3 (three) times daily as needed for cough.   PREDNISONE (STERAPRED UNI-PAK 21 TAB) 10 MG (21) TBPK TABLET    Take 6 pills on day one then decrease by 1 pill each day     Note:  This document was prepared using Dragon voice recognition software and may include unintentional dictation errors.    Faythe Ghee, PA-C 12/17/18 1259    Chesley Noon, MD 12/17/18 [phone]

## 2018-12-19 ENCOUNTER — Telehealth: Payer: Self-pay

## 2018-12-19 NOTE — Telephone Encounter (Signed)
Patient given negative result and verbalized understanding  

## 2019-02-16 ENCOUNTER — Encounter: Payer: Self-pay | Admitting: Family Medicine

## 2019-02-16 ENCOUNTER — Other Ambulatory Visit: Payer: Self-pay

## 2019-02-16 ENCOUNTER — Ambulatory Visit (INDEPENDENT_AMBULATORY_CARE_PROVIDER_SITE_OTHER): Payer: Self-pay | Admitting: Family Medicine

## 2019-02-16 VITALS — BP 160/80 | HR 76 | Temp 97.3°F | Resp 18 | Wt 196.0 lb

## 2019-02-16 DIAGNOSIS — M25562 Pain in left knee: Secondary | ICD-10-CM

## 2019-02-16 DIAGNOSIS — M7652 Patellar tendinitis, left knee: Secondary | ICD-10-CM

## 2019-02-16 DIAGNOSIS — M1711 Unilateral primary osteoarthritis, right knee: Secondary | ICD-10-CM

## 2019-02-16 DIAGNOSIS — G8929 Other chronic pain: Secondary | ICD-10-CM

## 2019-02-16 MED ORDER — MELOXICAM 15 MG PO TABS: 15.0000 mg | ORAL_TABLET | Freq: Every day | ORAL | 2 refills | Status: DC

## 2019-02-16 NOTE — Progress Notes (Signed)
Patient: Tammy Holt Female    DOB: [birth year]   64 y.o.   MRN: [id number] Visit Date: 02/16/2019  Today's Provider: Mila Merry, MD   Chief Complaint  Patient presents with  . Knee Pain    x 8 months   Subjective:     Knee Pain  Incident onset: 8 months ago. There was no injury mechanism. The pain is present in the right knee. Quality: throbbing. The pain is moderate. The pain has been worsening since onset. Associated symptoms include an inability to bear weight, numbness and tingling. Treatments tried: Diclofenac  The treatment provided moderate relief.  She was referred to orthopedist in 2019 but she never went due to 200 copay required before they would see her. She had ER visit in October and was prescribed diclofenac which she states she takes at least once every day and does help.    Allergies  Allergen Reactions  . Latex Rash    REACTION: Hives, Blisters     Current Outpatient Medications:  .  diclofenac (VOLTAREN) 75 MG EC tablet, Take 75 mg by mouth 2 (two) times daily., Disp: , Rfl:  .  lisinopril (PRINIVIL,ZESTRIL) 20 MG tablet, Take 1 tablet (20 mg total) by mouth daily., Disp: 30 tablet, Rfl: 12 .  albuterol (PROAIR HFA) 108 (90 BASE) MCG/ACT inhaler, Inhale 1-2 puffs into the lungs 4 (four) times daily as needed. For wheezing, Disp: , Rfl:  .  escitalopram (LEXAPRO) 20 MG tablet, Take 1 tablet (20 mg total) by mouth daily. (Patient not taking: Reported on 02/16/2019), Disp: 30 tablet, Rfl: 12 .  venlafaxine XR (EFFEXOR XR) 37.5 MG 24 hr capsule, One tablet daily for six days, then increase to 2 tablets daily (Patient not taking: Reported on 02/16/2019), Disp: 60 capsule, Rfl: 0  Review of Systems  Constitutional: Negative for appetite change, chills, fatigue and fever.  Respiratory: Negative for chest tightness and shortness of breath.   Cardiovascular: Positive for leg swelling (left ankle). Negative for chest pain and palpitations.  Gastrointestinal:  Negative for abdominal pain, nausea and vomiting.  Musculoskeletal: Positive for arthralgias (right knee pain).  Neurological: Positive for tingling, weakness (in right leg) and numbness. Negative for dizziness.    Social History   Tobacco Use  . Smoking status: Current Every Day Smoker    Packs/day: 1.00    Years: 40.00    Pack years: 40.00    Types: Cigarettes  . Smokeless tobacco: Never Used  . Tobacco comment: started at age 46 1/2-1 ppd  Substance Use Topics  . Alcohol use: Yes    Alcohol/week: 0.0 standard drinks    Comment: occasionally      Objective:   BP (!) 160/80 (BP Location: Right Arm, Cuff Size: Large)   Pulse 76   Temp (!) 97.3 F (36.3 C) (Temporal)   Resp 18   Wt 196 lb (88.9 kg)   SpO2 98% Comment: room air  BMI 35.85 kg/m  Vitals:   02/16/19 1050 02/16/19 1052  BP: (!) 150/72 (!) 160/80  Pulse: 76   Resp: 18   Temp: (!) 97.3 F (36.3 C)   TempSrc: Temporal   SpO2: 98%   Weight: 196 lb (88.9 kg)   Body mass index is 35.85 kg/m.   Physical Exam  MS: Tender inferior patella and prepatellar tendon down course of upper tibia. No swelling.      Assessment & Plan    1. Chronic pain of left knee She  does have some OA on previous Xrays, but is tender along course of patellar tendon. Will try change from prn diclofenac which she is taking every day to QD meloxicam  2. Patellar tendinitis of left knee  - meloxicam (MOBIC) 15 MG tablet; Take 1 tablet (15 mg total) by mouth daily.  Dispense: 30 tablet; Refill: 2  3. Primary osteoarthritis of right knee Recommend OTC Voltaren gel - meloxicam (MOBIC) 15 MG tablet; Take 1 tablet (15 mg total) by mouth daily.  Dispense: 30 tablet; Refill: 2  Advised she can call for orthopedic referral at her convenience.   The entirety of the information documented in the History of Present Illness, Review of Systems and Physical Exam were personally obtained by me. Portions of this information were initially  documented by Meyer Cory, CMA and reviewed by me for thoroughness and accuracy.      Lelon Huh, MD  [facility]

## 2019-02-16 NOTE — Patient Instructions (Addendum)
.   Please review the attached list of medications and notify my office if there are any errors.   . Try OTC Voltaren Gel. You can rub this around your knee up to four times a day to help with arthritis pain and tendonitis.    Call our office at [phone] when you are ready for referral to an orthopedists

## 2019-04-07 ENCOUNTER — Other Ambulatory Visit: Payer: Self-pay | Admitting: Family Medicine

## 2019-04-07 DIAGNOSIS — I10 Essential (primary) hypertension: Secondary | ICD-10-CM

## 2020-01-10 IMAGING — DX DG CHEST 1V PORT
1 series · 1 of 1 positions shown · non-contrast
Comparison: 03/20/2009

CLINICAL DATA: Cough.

EXAM:
PORTABLE CHEST 1 VIEW

[chest ap]
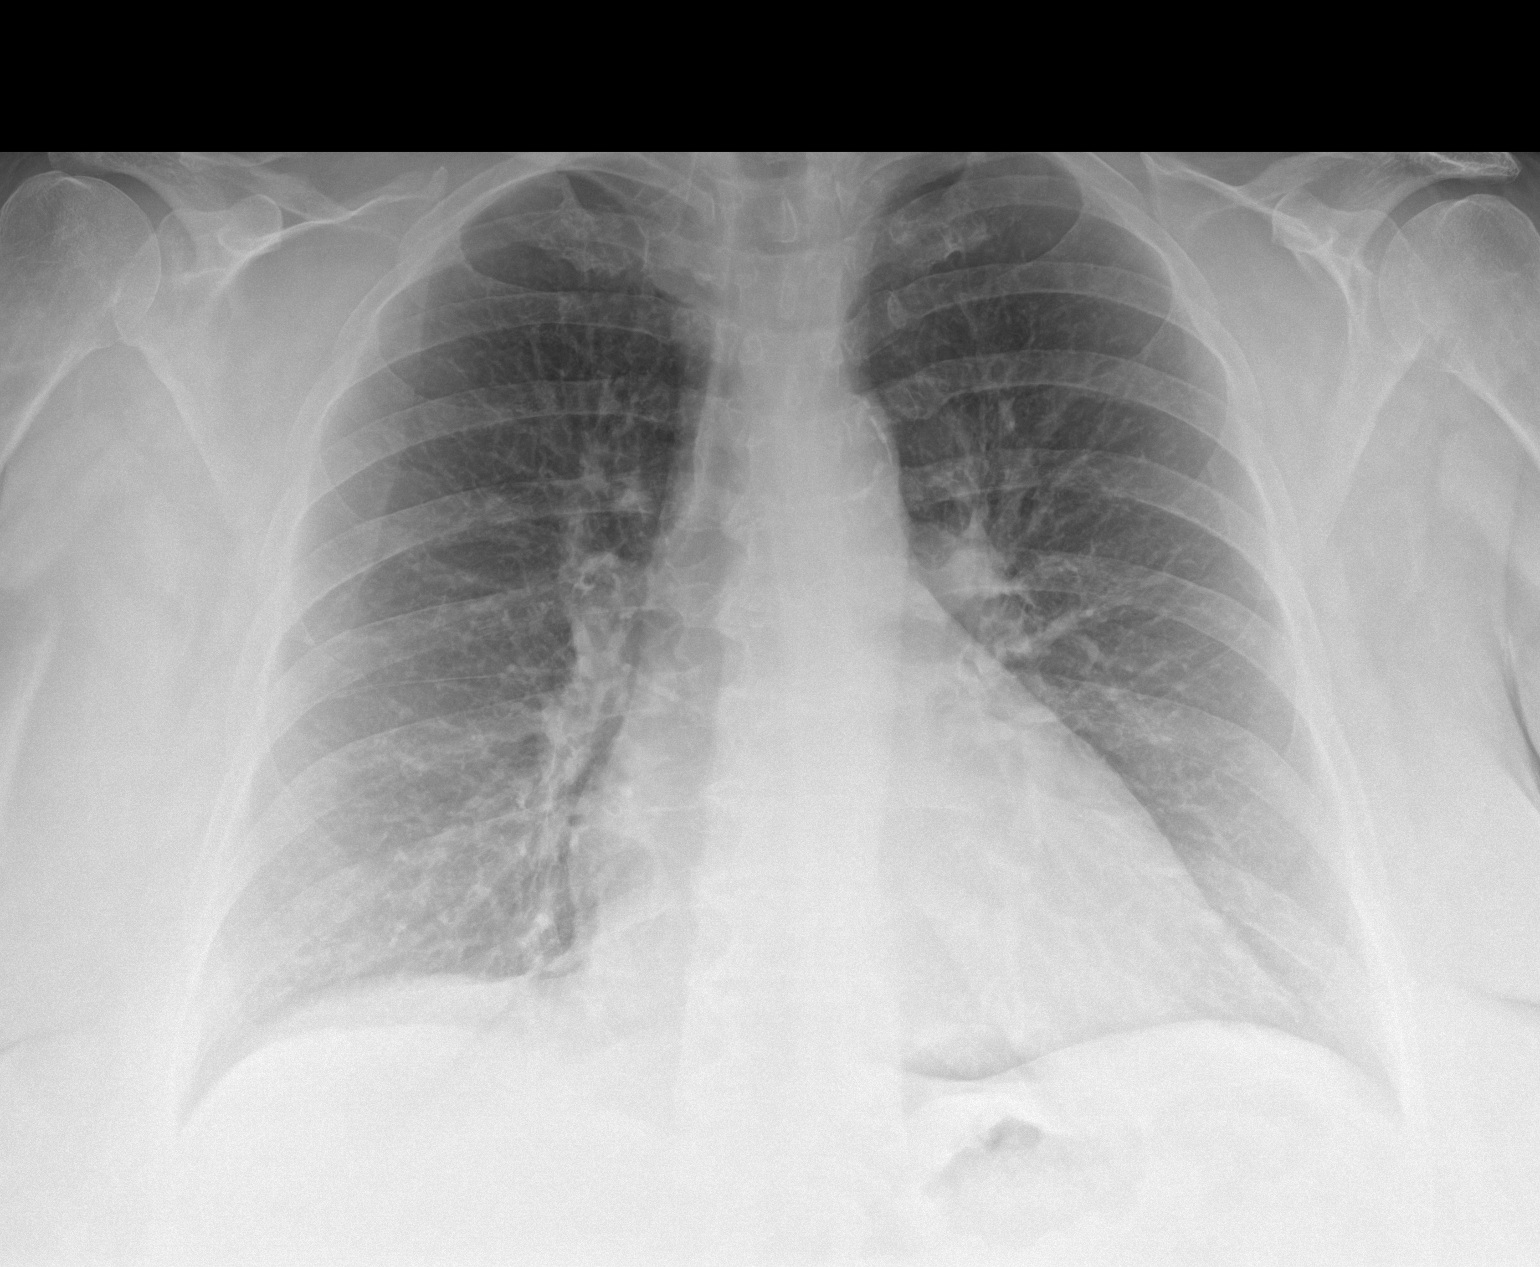

[1 of 1 positions shown; findings below may reference images not displayed]

FINDINGS: The heart size and mediastinal contours are within normal limits.
Both lungs are clear. The visualized skeletal structures are
unremarkable.
IMPRESSION: No active disease.

## 2020-02-26 ENCOUNTER — Ambulatory Visit: Payer: Self-pay | Admitting: *Deleted

## 2020-02-26 NOTE — Telephone Encounter (Signed)
Patient is calling to report she is having L leg numbness- ongoing- that seems to be getting worse- she reports she has fallen twice. Patient states her symptoms comes and goes and she has been diagnosed with bone spurs and arthritis in the leg.  Reason for Disposition . [1] Weakness of arm / hand, or leg / foot AND [2] is a chronic symptom (recurrent or ongoing AND present > 4 weeks)  Answer Assessment - Initial Assessment Questions 1. SYMPTOM: "What is the main symptom you are concerned about?" (e.g., weakness, numbness)     Left leg numbness 2. ONSET: "When did this start?" (minutes, hours, days; while sleeping)     On going- seems to be getting worse 3. LAST NORMAL: "When was the last time you were normal (no symptoms)?"     Over 6 months 4. PATTERN "Does this come and go, or has it been constant since it started?"  "Is it present now?"     Comes and goes 5. CARDIAC SYMPTOMS: "Have you had any of the following symptoms: chest pain, difficulty breathing, palpitations?"     no 6. NEUROLOGIC SYMPTOMS: "Have you had any of the following symptoms: headache, dizziness, vision loss, double vision, changes in speech, unsteady on your feet?"     No- goes numb and feels needle pain- hx bone spurs and arthritis  7. OTHER SYMPTOMS: "Do you have any other symptoms?"     Whole left leg 8. PREGNANCY: "Is there any chance you are pregnant?" "When was your last menstrual period?"     n/a  Protocols used: NEUROLOGIC DEFICIT-A-AH

## 2020-02-28 NOTE — Progress Notes (Signed)
°  ° ° °  Established patient visit   Patient: Tammy Holt   DOB: Dec 05, 1955   65 y.o. Female  MRN: [id number] Visit Date: 02/29/2020  Today's healthcare provider: Mila Merry, MD   Chief Complaint  Patient presents with   Numbness    Left leg numbness   Subjective    HPI HPI    Numbness     Additional comments: Left leg numbness       Last edited by Paticia Stack, CMA on 02/29/2020  8:22 AM. (History)      Numbness: Patient complains of ongoing bilateral leg pain for the past 6 months. Patient says she stands a lot at work and says it causes the numbness to come on even more. Over the last few weeks, has been having numbness in her left leg. Pt says the numbness is not as prevalent when she is not working, but is present all the time. Is worse the longer she is standing in one spot. Denies back pain. Legs also feel weak and has had a few falls.      Medications: Outpatient Medications Prior to Visit  Medication Sig   albuterol (VENTOLIN HFA) 108 (90 Base) MCG/ACT inhaler Inhale 1-2 puffs into the lungs 4 (four) times daily as needed. For wheezing   lisinopril (ZESTRIL) 20 MG tablet Take 1 tablet by mouth once daily   meloxicam (MOBIC) 15 MG tablet Take 1 tablet (15 mg total) by mouth daily. (Patient not taking: Reported on 02/29/2020)   No facility-administered medications prior to visit.    Review of Systems  Neurological: Positive for weakness and numbness.       Objective    BP (!) 156/77 (BP Location: Right Arm, Patient Position: Sitting, Cuff Size: Large)    Pulse 80    Ht 5' (1.524 m)    Wt 203 lb (92.1 kg)    SpO2 98%    BMI 39.65 kg/m     Physical Exam   Slow to stand from sitting position due to leg weakness. Normal s/s. Slightly staggered gait due to leg weakness. No back tenderness.     Assessment & Plan     1. Sciatica of left side  - gabapentin (NEURONTIN) 300 MG capsule; Take 1 capsule (300 mg total) by mouth at bedtime.  Dispense: 30  capsule; Refill: 1 - predniSONE (DELTASONE) 10 MG tablet; 6 tablets for 2 days, then 5 for 2 days, then 4 for 2 days, then 3 for 2 days, then 2 for 2 days, then 1 for 2 days.  Dispense: 42 tablet; Refill: 0   Consider XR LS spine if above is not effective.    she declined all HM recommendation due to lack of insurance.      The entirety of the information documented in the History of Present Illness, Review of Systems and Physical Exam were personally obtained by me. Portions of this information were initially documented by the CMA and reviewed by me for thoroughness and accuracy.      Mila Merry, MD  [facility] [phone] (phone) [phone] (fax)  [facility]

## 2020-02-29 ENCOUNTER — Ambulatory Visit (INDEPENDENT_AMBULATORY_CARE_PROVIDER_SITE_OTHER): Payer: Self-pay | Admitting: Family Medicine

## 2020-02-29 ENCOUNTER — Other Ambulatory Visit: Payer: Self-pay

## 2020-02-29 VITALS — BP 156/77 | HR 80 | Temp 98.0°F | Ht 60.0 in | Wt 203.0 lb

## 2020-02-29 DIAGNOSIS — M5432 Sciatica, left side: Secondary | ICD-10-CM

## 2020-02-29 MED ORDER — PREDNISONE 10 MG PO TABS: ORAL_TABLET | ORAL | 0 refills | Status: AC

## 2020-02-29 MED ORDER — GABAPENTIN 300 MG PO CAPS: 300.0000 mg | ORAL_CAPSULE | Freq: Every day | ORAL | 1 refills | Status: DC

## 2020-02-29 NOTE — Patient Instructions (Signed)
Please call the [facility] at [facility] at [phone] to schedule your mammogram.  

## 2020-06-25 ENCOUNTER — Other Ambulatory Visit: Payer: Self-pay | Admitting: Family Medicine

## 2020-06-25 DIAGNOSIS — I10 Essential (primary) hypertension: Secondary | ICD-10-CM

## 2020-09-29 ENCOUNTER — Telehealth: Payer: Self-pay

## 2020-09-29 NOTE — Telephone Encounter (Signed)
Copied from CRM [id number]. Topic: Appointment Scheduling - Scheduling Inquiry for Clinic >> Sep 29, 2020 10:40 AM Pawlus, Maxine Glenn A wrote: Reason for CRM: Pt stated she has been having sinus issues, denied having COVID in the last 10 days, wanted to be seen this week, no open appts until October, pt stated she can not wait that long and wanted a call back.

## 2020-09-30 ENCOUNTER — Other Ambulatory Visit: Payer: Self-pay | Admitting: Family Medicine

## 2020-09-30 DIAGNOSIS — I10 Essential (primary) hypertension: Secondary | ICD-10-CM

## 2020-09-30 NOTE — Telephone Encounter (Signed)
Spoke to patient about her issue with sinuses.  She said she bought some Claritin and its helping her issue.    I told her if she became worse, to go to an urgent care as all of our providers are booked for the rest of the week. She said she would.

## 2020-10-01 ENCOUNTER — Other Ambulatory Visit: Payer: Self-pay | Admitting: Family Medicine

## 2020-10-01 DIAGNOSIS — I10 Essential (primary) hypertension: Secondary | ICD-10-CM

## 2020-10-01 NOTE — Telephone Encounter (Signed)
Attempted to call patient to schedule appointment-left message to call office. Courtesy RF sent to pharmacy yesterday.

## 2020-11-11 ENCOUNTER — Telehealth: Payer: Self-pay
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
## 2020-11-12 NOTE — Telephone Encounter (Signed)
Apt 11/21/2020 at 9:40am  Pt advised.    Thanks,   -Vernona Rieger

## 2020-11-12 NOTE — Telephone Encounter (Signed)
LMTCB 11/12/2020.     Thanks,   -Vernona Rieger

## 2020-11-14 ENCOUNTER — Ambulatory Visit: Payer: Self-pay | Admitting: Family Medicine

## 2020-11-21 ENCOUNTER — Telehealth: Payer: Self-pay | Admitting: *Deleted

## 2020-11-21 ENCOUNTER — Ambulatory Visit (INDEPENDENT_AMBULATORY_CARE_PROVIDER_SITE_OTHER): Payer: Self-pay | Admitting: Family Medicine

## 2020-11-21 ENCOUNTER — Other Ambulatory Visit: Payer: Self-pay

## 2020-11-21 ENCOUNTER — Encounter: Payer: Self-pay | Admitting: Family Medicine

## 2020-11-21 VITALS — BP 165/79 | HR 74 | Temp 98.7°F | Resp 16 | Wt 206.0 lb

## 2020-11-21 DIAGNOSIS — E1121 Type 2 diabetes mellitus with diabetic nephropathy: Secondary | ICD-10-CM

## 2020-11-21 DIAGNOSIS — I10 Essential (primary) hypertension: Secondary | ICD-10-CM

## 2020-11-21 DIAGNOSIS — M5432 Sciatica, left side: Secondary | ICD-10-CM

## 2020-11-21 DIAGNOSIS — E7800 Pure hypercholesterolemia, unspecified: Secondary | ICD-10-CM

## 2020-11-21 MED ORDER — GABAPENTIN 300 MG PO CAPS: 300.0000 mg | ORAL_CAPSULE | Freq: Every day | ORAL | 1 refills | Status: DC

## 2020-11-21 MED ORDER — VENLAFAXINE HCL ER 37.5 MG PO CP24: 37.5000 mg | ORAL_CAPSULE | Freq: Every day | ORAL | 0 refills | Status: DC

## 2020-11-21 MED ORDER — LISINOPRIL 20 MG PO TABS: 20.0000 mg | ORAL_TABLET | Freq: Every day | ORAL | 1 refills | Status: DC

## 2020-11-21 NOTE — Telephone Encounter (Signed)
Pharmacy advised  

## 2020-11-21 NOTE — Progress Notes (Signed)
Established patient visit   Patient: Tammy Holt   DOB: 02/16/1955   65 y.o. Female  MRN: [id number] Visit Date: 11/21/2020  Today's healthcare provider: Mila Merry, MD   Chief Complaint  Patient presents with   Hypertension   Subjective    HPI  Hypertension, follow-up  BP Readings from Last 3 Encounters:  11/21/20 (!) 165/79  02/29/20 (!) 156/77  02/16/19 (!) 160/80   Wt Readings from Last 3 Encounters:  11/21/20 206 lb (93.4 kg)  02/29/20 203 lb (92.1 kg)  02/16/19 196 lb (88.9 kg)     She was last seen for hypertension more than 2 years ago.   Current treatment includes Lisinopril 20mg  daily.  She reports good compliance with treatment. She is not having side effects.  She is following a Regular diet. She is exercising. She does smoke.  Use of agents associated with hypertension: none.   Outside blood pressures are not checked. Symptoms: No chest pain No chest pressure  No palpitations No syncope  No dyspnea No orthopnea  No paroxysmal nocturnal dyspnea No lower extremity edema   ---------------------------------------------------------------------------------------------------   Diabetes Mellitus Type II, Follow-up  Lab Results  Component Value Date   HGBA1C 6.0 03/15/2016   HGBA1C 5.8 09/01/2015   HGBA1C 6.0 03/03/2015   Wt Readings from Last 3 Encounters:  11/21/20 206 lb (93.4 kg)  02/29/20 203 lb (92.1 kg)  02/16/19 196 lb (88.9 kg)   Last seen for diabetes 4 years ago.  Management since then includes continuing lifestyle modification. She reports good compliance with treatment. She is not having side effects.   Home blood sugar records:  blood sugars are not checked  Episodes of hypoglycemia? No    Current insulin regiment: none; diet controlled Most Recent Eye Exam: not UTD Current exercise: walking Current diet habits: well  balanced    ---------------------------------------------------------------------------------------------------  She would also like to start back on medication to help with depression and anxiety. She previously took paroxetine 40mg , but stopped over a year ago because it didn't seem to be helping. She has had more stress on her lately and would like to try a different medication.   Depression screen [facility] 2/9 11/21/2020 02/29/2020 02/16/2019  Decreased Interest 2 3 0  Down, Depressed, Hopeless 2 3 0  PHQ - 2 Score 4 6 0  Altered sleeping 2 3 1   Tired, decreased energy 2 3 1   Change in appetite 2 3 1   Feeling bad or failure about yourself  0 2 0  Trouble concentrating 1 0 0  Moving slowly or fidgety/restless 0 0 0  Suicidal thoughts 0 0 0  PHQ-9 Score 11 17 3   Difficult doing work/chores Very difficult Somewhat difficult Not difficult at all      Medications: Outpatient Medications Prior to Visit  Medication Sig   albuterol (VENTOLIN HFA) 108 (90 Base) MCG/ACT inhaler Inhale 1-2 puffs into the lungs 4 (four) times daily as needed. For wheezing   gabapentin (NEURONTIN) 300 MG capsule Take 1 capsule (300 mg total) by mouth at bedtime.   lisinopril (ZESTRIL) 20 MG tablet Take 1 tablet by mouth once daily   meloxicam (MOBIC) 15 MG tablet Take 1 tablet (15 mg total) by mouth daily.   No facility-administered medications prior to visit.    Review of Systems  Constitutional:  Negative for appetite change, chills, fatigue and fever.  Respiratory:  Negative for chest tightness and shortness of breath.   Cardiovascular:  Negative for  chest pain and palpitations.  Gastrointestinal:  Negative for abdominal pain, nausea and vomiting.  Neurological:  Negative for dizziness and weakness.      Objective    BP (!) 165/79 (BP Location: Right Arm, Patient Position: Sitting, Cuff Size: Large)   Pulse 74   Temp 98.7 F (37.1 C) (Oral)   Resp 16   Wt 206 lb (93.4 kg)   SpO2 99% Comment: room  air  BMI 40.23 kg/m  {Show previous vital signs (optional):23777}  Physical Exam  General appearance: Obese female, cooperative and in no acute distress Head: Normocephalic, without obvious abnormality, atraumatic Respiratory: Respirations even and unlabored, normal respiratory rate Extremities: All extremities are intact.  Skin: Skin color, texture, turgor normal. No rashes seen  Psych: Appropriate mood and affect. Neurologic: Mental status: Alert, oriented to person, place, and time, thought content appropriate.    Assessment & Plan     1. Essential hypertension Has not been taking lisinopril consistently since prescription has run out. Likely made worse by anxiety.  - Renal function panel  refill lisinopril (ZESTRIL) 20 MG tablet; Take 1 tablet (20 mg total) by mouth daily.  Dispense: 30 tablet; Refill: 1  2. Pure hypercholesterolemia Not currently on medication.  - Lipid panel  3. Diabetes mellitus with nephropathy (HCC)  - Hemoglobin A1c  4. Sciatica of left side She is out of gabapentin which she though was helpful. Refill gabapentin (NEURONTIN) 300 MG capsule; Take 1 capsule (300 mg total) by mouth at bedtime.  Dispense: 30 capsule; Refill: 1  5. Depression and anxiety She would like to try something different than paroxetine which she previously took for extended perior and didn't think it helped  Try prescription for venlafaxine XR (EFFEXOR XR) 37.5 MG 24 hr capsule; Take 1 capsule (37.5 mg total) by mouth daily with breakfast. Increase to capsules after 2 weeks.  Dispense: 30 capsule; Refill: 0      The entirety of the information documented in the History of Present Illness, Review of Systems and Physical Exam were personally obtained by me. Portions of this information were initially documented by the CMA and reviewed by me for thoroughness and accuracy.     Mila Merry, MD  [facility] [phone] (phone) [phone] (fax)  [facility]

## 2020-11-21 NOTE — Telephone Encounter (Signed)
Should read to to take one daily for 2 weeks, then increase to 2 daily.

## 2020-11-21 NOTE — Telephone Encounter (Signed)
Copied from CRM [id number]. Topic: General - Other >> Nov 21, 2020 10:43 AM Gaetana Michaelis A wrote: Reason for CRM: The patient's pharmacist has called requesting clarification on the directions for the patient's prescription for venlafaxine XR (EFFEXOR XR) 37.5 MG 24 hr capsule [[id number]]   venlafaxine XR (EFFEXOR XR) 37.5 MG 24 hr capsule [[id number]]   Order Details Dose: 37.5 mg Route: Oral Frequency: Daily with breakfast Dispense Quantity: 30 capsule Refills: 0     Sig: Take 1 capsule (37.5 mg total) by mouth daily with breakfast. Increase to capsules after 2 weeks.  Please contact further when possible

## 2020-11-22 LAB — RENAL FUNCTION PANEL
Albumin: 4.4 g/dL (ref 3.8–4.8)
BUN/Creatinine Ratio: 13 (ref 12–28)
BUN: 10 mg/dL (ref 8–27)
CO2: 26 mmol/L (ref 20–29)
Calcium: 9.9 mg/dL (ref 8.7–10.3)
Chloride: 103 mmol/L (ref 96–106)
Creatinine, Ser: 0.75 mg/dL (ref 0.57–1.00)
Glucose: 108 mg/dL — ABNORMAL HIGH (ref 70–99)
Phosphorus: 3.5 mg/dL (ref 3.0–4.3)
Potassium: 4.9 mmol/L (ref 3.5–5.2)
Sodium: 141 mmol/L (ref 134–144)
eGFR: 89 mL/min/{1.73_m2} (ref >59)

## 2020-11-22 LAB — LIPID PANEL
Chol/HDL Ratio: 4.4 ratio (ref 0.0–4.4)
Cholesterol, Total: 192 mg/dL (ref 100–199)
HDL: 44 mg/dL (ref >39)
LDL Chol Calc (NIH): 128 mg/dL — ABNORMAL HIGH (ref 0–99)
Triglycerides: 109 mg/dL (ref 0–149)
VLDL Cholesterol Cal: 20 mg/dL (ref 5–40)

## 2020-11-22 LAB — HEMOGLOBIN A1C
Est. average glucose Bld gHb Est-mCnc: 131 mg/dL
Hgb A1c MFr Bld: 6.2 % — ABNORMAL HIGH (ref 4.8–5.6)

## 2020-12-17 ENCOUNTER — Telehealth: Payer: Self-pay | Admitting: Family Medicine

## 2020-12-17 ENCOUNTER — Other Ambulatory Visit: Payer: Self-pay | Admitting: Family Medicine

## 2020-12-17 NOTE — Telephone Encounter (Signed)
Can you see how patient is doing with the venlafaxine prescription. The plan was to increase to 75 mg capsules if she has been tolerating the medication. I think a 37.5mg  was sent in by the [facility], but if she hasn't picked it up we can change to to 75mg .

## 2020-12-17 NOTE — Telephone Encounter (Signed)
Lmtcb, PEC please advise as below 

## 2020-12-18 ENCOUNTER — Other Ambulatory Visit: Payer: Self-pay

## 2020-12-18 DIAGNOSIS — F419 Anxiety disorder, unspecified: Secondary | ICD-10-CM

## 2020-12-18 DIAGNOSIS — I10 Essential (primary) hypertension: Secondary | ICD-10-CM

## 2020-12-18 MED ORDER — LISINOPRIL 20 MG PO TABS: 20.0000 mg | ORAL_TABLET | Freq: Every day | ORAL | 0 refills | Status: DC

## 2020-12-18 MED ORDER — VENLAFAXINE HCL ER 75 MG PO CP24: 75.0000 mg | ORAL_CAPSULE | Freq: Every day | ORAL | 0 refills | Status: DC

## 2020-12-18 NOTE — Telephone Encounter (Signed)
Pt. States she is doing "good on the Effexor. I don't seem to be crying as much." Request Effexor 75 mg be sent to her pharmacy. Also requests a 90-day supply of lisinopril be called in to her pharmacy. Please advise pt.

## 2020-12-18 NOTE — Telephone Encounter (Signed)
Medication change and additional refill was sent in.  KP

## 2020-12-18 NOTE — Telephone Encounter (Signed)
Tried calling patient. Left message to call back. OK for PEC triage to speak with patient.  

## 2021-01-16 ENCOUNTER — Other Ambulatory Visit: Payer: Self-pay | Admitting: Family Medicine

## 2021-02-15 ENCOUNTER — Other Ambulatory Visit: Payer: Self-pay | Admitting: Family Medicine

## 2021-02-15 DIAGNOSIS — I10 Essential (primary) hypertension: Secondary | ICD-10-CM

## 2021-03-17 ENCOUNTER — Encounter: Payer: Self-pay | Admitting: *Deleted

## 2021-03-17 ENCOUNTER — Other Ambulatory Visit: Payer: Self-pay | Admitting: Family Medicine

## 2021-03-17 DIAGNOSIS — F419 Anxiety disorder, unspecified: Secondary | ICD-10-CM

## 2021-03-17 MED ORDER — VENLAFAXINE HCL ER 75 MG PO CP24: 75.0000 mg | ORAL_CAPSULE | Freq: Every day | ORAL | 0 refills | Status: DC

## 2021-03-17 NOTE — Telephone Encounter (Signed)
Return call from patient. Reviewed message from Dr. Caryn Section regarding medication. Patient reports she is taking 2 capsules every day of 37.5 mg tablets. Reviewed with patient PCP can change prescription to 75 mg capsules.  ?

## 2021-03-17 NOTE — Telephone Encounter (Signed)
Tried calling patient. Left message to call back. OK for PEC triage to advise.  ?

## 2021-03-17 NOTE — Telephone Encounter (Signed)
This encounter was created in error - please disregard.

## 2021-03-17 NOTE — Telephone Encounter (Signed)
Is she taking 2 capsules every day? If so I can change prescription to 75mg  capsules ?

## 2021-04-30 ENCOUNTER — Other Ambulatory Visit: Payer: Self-pay | Admitting: Family Medicine

## 2021-04-30 DIAGNOSIS — F419 Anxiety disorder, unspecified: Secondary | ICD-10-CM

## 2021-04-30 MED ORDER — VENLAFAXINE HCL ER 75 MG PO CP24: 75.0000 mg | ORAL_CAPSULE | Freq: Every day | ORAL | 0 refills | Status: DC

## 2021-04-30 NOTE — Telephone Encounter (Signed)
Medication Refill - Medication: venlafaxine XR (EFFEXOR-XR) 75 MG 24 hr capsule  ? ?Has the patient contacted their pharmacy? Yes.   Pt told to contact provider ? ?Preferred Pharmacy (with phone number or street name):  ?[facility] [address] [place], [place] - [address] Phone:  [phone]  ?Fax:  [phone]  ?  ? ?Has the patient been seen for an appointment in the last year OR does the patient have an upcoming appointment? Yes.   ? ?Agent: Please be advised that RX refills may take up to 3 business days. We ask that you follow-up with your pharmacy. ? ?

## 2021-04-30 NOTE — Telephone Encounter (Signed)
Requested medications are due for refill today.  yes ? ?Requested medications are on the active medications list.  yes ? ?Last refill. 03/17/2021 #30 0 refills ? ?Future visit scheduled.   no ? ?Notes to clinic.  Rx written to expired 04/16/2021 Rx is expired. ? ? ? ?Requested Prescriptions  ?Pending Prescriptions Disp Refills  ? venlafaxine XR (EFFEXOR-XR) 75 MG 24 hr capsule 30 capsule 0  ?  Sig: Take 1 capsule (75 mg total) by mouth daily.  ?  ? Psychiatry: Antidepressants - SNRI - desvenlafaxine & venlafaxine Failed - 04/30/2021 12:26 PM  ?  ?  Failed - Last BP in normal range  ?  BP Readings from Last 1 Encounters:  ?11/21/20 (!) 165/79  ?  ?  ?  ?  Failed - Lipid Panel in normal range within the last 12 months  ?  Cholesterol, Total  ?Date Value Ref Range Status  ?11/21/2020 192 100 - 199 mg/dL Final  ? ?LDL Chol Calc (NIH)  ?Date Value Ref Range Status  ?11/21/2020 128 (H) 0 - 99 mg/dL Final  ? ?HDL  ?Date Value Ref Range Status  ?11/21/2020 44 >39 mg/dL Final  ? ?Triglycerides  ?Date Value Ref Range Status  ?11/21/2020 109 0 - 149 mg/dL Final  ? ?  ?  ?  Passed - Cr in normal range and within 360 days  ?  Creatinine, Ser  ?Date Value Ref Range Status  ?11/21/2020 0.75 0.57 - 1.00 mg/dL Final  ?  ?  ?  ?  Passed - Completed PHQ-2 or PHQ-9 in the last 360 days  ?  ?  Passed - Valid encounter within last 6 months  ?  Recent Outpatient Visits   ? ?      ? 5 months ago Essential hypertension  ? [facility] Sherrie Mustache, Demetrios Isaacs, MD  ? 1 year ago Sciatica of left side  ? [facility] [name], MD  ? 2 years ago Chronic pain of left knee  ? [facility] [name], MD  ? 3 years ago COPD exacerbation [facility])  ? [facility] Maple Hudson., MD  ? 3 years ago Chronic pain of right knee  ? [facility] Sherrie Mustache, Demetrios Isaacs, MD  ? ?  ?  ? ? ?  ?  ?  ?  ?

## 2021-05-18 NOTE — Progress Notes (Signed)
Established patient visit   Patient: Tammy Holt   DOB: 29-Dec-1955   66 y.o. Female  MRN: [id number] Visit Date: 05/19/2021  Today's healthcare provider: Lelon Huh, MD   Chief Complaint  Patient presents with   Hypertension   Depression   Anxiety   Subjective    HPI  Hypertension, follow-up  BP Readings from Last 3 Encounters:  05/19/21 (!) 151/67  11/21/20 (!) 165/79  02/29/20 (!) 156/77   Wt Readings from Last 3 Encounters:  05/19/21 205 lb 11.2 oz (93.3 kg)  11/21/20 206 lb (93.4 kg)  02/29/20 203 lb (92.1 kg)     She was last seen for hypertension 6 months ago.  BP at that visit was 165/79. Management since that visit includes resume medication.  She reports excellent compliance with treatment. She is not having side effects.  She is following a Regular diet. She is not exercising. She does smoke.  Use of agents associated with hypertension: none.   Outside blood pressures are not checked. Symptoms: No chest pain No chest pressure  No palpitations No syncope  No dyspnea No orthopnea  No paroxysmal nocturnal dyspnea Yes lower extremity edema   Pertinent labs Lab Results  Component Value Date   CHOL 192 11/21/2020   HDL 44 11/21/2020   LDLCALC 128 (H) 11/21/2020   TRIG 109 11/21/2020   CHOLHDL 4.4 11/21/2020   Lab Results  Component Value Date   NA 141 11/21/2020   K 4.9 11/21/2020   CREATININE 0.75 11/21/2020   EGFR 89 11/21/2020   GLUCOSE 108 (H) 11/21/2020     The 10-year ASCVD risk score (Arnett DK, et al., 2019) is: 32.4%  ---------------------------------------------------------------------------------------------------  Depression and Anxiety, Follow-up  She  was last seen for this 5-6 months ago. Changes made at last visit include Try prescription for venlafaxine XR (EFFEXOR XR) 37.5 MG 24 hr capsule.   She reports excellent compliance with treatment. She is not having side effects.   She reports excellent tolerance of  treatment. Current symptoms include:  none She feels she is Unchanged since last visit. Although she she only recently went up to the 32m venlafaxine and does feel like it helps. She has had more stress lately due to problems with her care and transportation and family illnesses.      11/21/2020   10:09 AM 02/29/2020    8:29 AM 02/16/2019   10:54 AM  Depression screen PHQ 2/9  Decreased Interest 2 3 0  Down, Depressed, Hopeless 2 3 0  PHQ - 2 Score 4 6 0  Altered sleeping 2 3 1   Tired, decreased energy 2 3 1   Change in appetite 2 3 1   Feeling bad or failure about yourself  0 2 0  Trouble concentrating 1 0 0  Moving slowly or fidgety/restless 0 0 0  Suicidal thoughts 0 0 0  PHQ-9 Score 11 17 3   Difficult doing work/chores Very difficult Somewhat difficult Not difficult at all    -----------------------------------------------------------------------------------------   Medications: Outpatient Medications Prior to Visit  Medication Sig   albuterol (VENTOLIN HFA) 108 (90 Base) MCG/ACT inhaler Inhale 1-2 puffs into the lungs 4 (four) times daily as needed. For wheezing   gabapentin (NEURONTIN) 300 MG capsule Take 1 capsule (300 mg total) by mouth at bedtime.   lisinopril (ZESTRIL) 20 MG tablet Take 1 tablet by mouth once daily   venlafaxine XR (EFFEXOR-XR) 75 MG 24 hr capsule Take 1 capsule (75 mg total) by  mouth daily.   No facility-administered medications prior to visit.         Objective    BP (!) 151/67   Pulse 86   Resp 16   Wt 205 lb 11.2 oz (93.3 kg)   SpO2 95%   BMI 40.17 kg/m    Physical Exam  General appearance: Obese female, cooperative and in no acute distress Head: Normocephalic, without obvious abnormality, atraumatic Respiratory: Respirations even and unlabored, normal respiratory rate Extremities: All extremities are intact.  Skin: Skin color, texture, turgor normal. No rashes seen  Psych: Appropriate mood and affect. Neurologic: Mental status:  Alert, oriented to person, place, and time, thought content appropriate.   Assessment & Plan     1. Acute anxiety She feels like venlafaxine helps a little bit, but not completely controled. She has only recently gone up to 56m. reill venlafaxine XR (EFFEXOR-XR) 75 MG 24 hr capsule; Take 1 capsule (75 mg total) by mouth daily.  Dispense: 90 capsule; Refill: 0  She previously took prn alprazolam which she would like to have available again. Prescription  ALPRAZolam (XANAX) 0.5 MG tablet; Take 1 tablet (0.5 mg total) by mouth 2 (two) times daily as needed for anxiety.  Dispense: 30 tablet; Refill: 3  2. Essential hypertension Improved since restarting lisinopril, but SBP not at goal. Will Add amLODipine (NORVASC) 2.5 MG tablet; Take 1 tablet (2.5 mg total) by mouth daily.  Dispense: 90 tablet; Refill: 0  3. Smoking greater than 30 pack years She is going to try to quit [place] She is not  interested in medication to help at this point.    Future Appointments  Date Time Provider DKelford 08/04/2021  9:40 AM FCaryn Section DKirstie Peri MD [facility]        The entirety of the information documented in the History of Present Illness, Review of Systems and Physical Exam were personally obtained by me. Portions of this information were initially documented by the CMA and reviewed by me for thoroughness and accuracy.     DLelon Huh MD  [phone](phone) [phone](fax)  [place]

## 2021-05-19 ENCOUNTER — Encounter: Payer: Self-pay | Admitting: Family Medicine

## 2021-05-19 ENCOUNTER — Ambulatory Visit (INDEPENDENT_AMBULATORY_CARE_PROVIDER_SITE_OTHER): Payer: Self-pay | Admitting: Family Medicine

## 2021-05-19 VITALS — BP 151/67 | HR 86 | Resp 16 | Wt 205.7 lb

## 2021-05-19 DIAGNOSIS — F419 Anxiety disorder, unspecified: Secondary | ICD-10-CM

## 2021-05-19 DIAGNOSIS — F17210 Nicotine dependence, cigarettes, uncomplicated: Secondary | ICD-10-CM

## 2021-05-19 DIAGNOSIS — I10 Essential (primary) hypertension: Secondary | ICD-10-CM

## 2021-05-19 MED ORDER — VENLAFAXINE HCL ER 75 MG PO CP24: 75.0000 mg | ORAL_CAPSULE | Freq: Every day | ORAL | 0 refills | Status: DC

## 2021-05-19 MED ORDER — ALPRAZOLAM 0.5 MG PO TABS: 0.5000 mg | ORAL_TABLET | Freq: Two times a day (BID) | ORAL | 3 refills | Status: DC | PRN

## 2021-05-19 MED ORDER — AMLODIPINE BESYLATE 2.5 MG PO TABS: 2.5000 mg | ORAL_TABLET | Freq: Every day | ORAL | 0 refills | Status: DC

## 2021-05-19 NOTE — Patient Instructions (Signed)
.   Please review the attached list of medications and notify my office if there are any errors.   . Please bring all of your medications to every appointment so we can make sure that our medication list is the same as yours.   

## 2021-08-04 ENCOUNTER — Ambulatory Visit: Payer: Self-pay | Admitting: Family Medicine

## 2021-08-07 ENCOUNTER — Other Ambulatory Visit: Payer: Self-pay | Admitting: Family Medicine

## 2021-08-07 DIAGNOSIS — I10 Essential (primary) hypertension: Secondary | ICD-10-CM

## 2021-08-12 ENCOUNTER — Other Ambulatory Visit: Payer: Self-pay | Admitting: Family Medicine

## 2021-08-12 DIAGNOSIS — F419 Anxiety disorder, unspecified: Secondary | ICD-10-CM

## 2021-11-01 ENCOUNTER — Other Ambulatory Visit: Payer: Self-pay | Admitting: Family Medicine

## 2021-11-01 DIAGNOSIS — I10 Essential (primary) hypertension: Secondary | ICD-10-CM

## 2021-11-18 ENCOUNTER — Ambulatory Visit: Payer: Self-pay | Admitting: Family Medicine

## 2021-11-20 ENCOUNTER — Ambulatory Visit: Payer: Self-pay | Admitting: Family Medicine

## 2021-11-23 ENCOUNTER — Ambulatory Visit: Payer: Self-pay | Admitting: Family Medicine

## 2021-11-25 ENCOUNTER — Other Ambulatory Visit: Payer: Self-pay | Admitting: Family Medicine

## 2021-11-25 DIAGNOSIS — F419 Anxiety disorder, unspecified: Secondary | ICD-10-CM

## 2021-12-09 NOTE — Progress Notes (Deleted)
Established patient visit   Patient: Tammy Holt   DOB: [birth year]   66 y.o. Female  MRN: [id number] Visit Date: 12/11/2021  Today's healthcare provider: Lelon Huh, MD   No chief complaint on file.  Subjective    HPI  Hypertension, follow-up  BP Readings from Last 3 Encounters:  05/19/21 (!) 151/67  11/21/20 (!) 165/79  02/29/20 (!) 156/77   Wt Readings from Last 3 Encounters:  05/19/21 205 lb 11.2 oz (93.3 kg)  11/21/20 206 lb (93.4 kg)  02/29/20 203 lb (92.1 kg)     She was last seen for hypertension 6 months ago.  Management since that visit includes; added amlodipine 2.5 mg .  She reports {excellent/good/fair/poor:19665} compliance with treatment. She {is/is not:9024} having side effects. {document side effects if present:1} She is following a {diet:21022986} diet. She {is/is not:9024} exercising. She {does/does not:200015} smoke.  Use of agents associated with hypertension: {bp agents assoc with hypertension:511::"none"}.   Outside blood pressures are {***enter patient reported home BP readings, or 'not being checked':1}.  Pertinent labs Lab Results  Component Value Date   CHOL 192 11/21/2020   HDL 44 11/21/2020   LDLCALC 128 (H) 11/21/2020   TRIG 109 11/21/2020   CHOLHDL 4.4 11/21/2020   Lab Results  Component Value Date   NA 141 11/21/2020   K 4.9 11/21/2020   CREATININE 0.75 11/21/2020   EGFR 89 11/21/2020   GLUCOSE 108 (H) 11/21/2020     The 10-year ASCVD risk score (Arnett DK, et al., 2019) is: 32.4%  --------------------------------------------------------------------------------------------------- Anxiety, Follow-up  She was last seen for anxiety 6 months ago. Changes made at last visit include; restarted alprazolam 0.5 mg prn.   She reports {excellent/good/fair/poor:19665} compliance with treatment. She reports {good/fair/poor:18685} tolerance of treatment. She {is/is not:21021397} having side effects. {document side effects  if present:1}  She feels her anxiety is {Desc; severity:60313} and {improved/worse/unchanged:3041574} since last visit.  GAD-7 Results     No data to display          PHQ-9 Scores    11/21/2020   10:09 AM 02/29/2020    8:29 AM 02/16/2019   10:54 AM  PHQ9 SCORE ONLY  PHQ-9 Total Score _0 ---------------------------------------------------------------------------------------------------   Medications: Outpatient Medications Prior to Visit  Medication Sig   albuterol (VENTOLIN HFA) 108 (90 Base) MCG/ACT inhaler Inhale 1-2 puffs into the lungs 4 (four) times daily as needed. For wheezing   ALPRAZolam (XANAX) 0.5 MG tablet Take 1 tablet by mouth twice daily as needed for anxiety   amLODipine (NORVASC) 2.5 MG tablet Take 1 tablet by mouth once daily   gabapentin (NEURONTIN) 300 MG capsule Take 1 capsule (300 mg total) by mouth at bedtime.   lisinopril (ZESTRIL) 20 MG tablet Take 1 tablet by mouth once daily   venlafaxine XR (EFFEXOR-XR) 75 MG 24 hr capsule Take 1 capsule by mouth once daily   No facility-administered medications prior to visit.    Review of Systems  Constitutional:  Negative for appetite change, chills, fatigue and fever.  Respiratory:  Negative for chest tightness and shortness of breath.   Cardiovascular:  Negative for chest pain and palpitations.  Gastrointestinal:  Negative for abdominal pain, nausea and vomiting.  Neurological:  Negative for dizziness and weakness.    {Labs  Heme  Chem  Endocrine  Serology  Results Review (optional):23779}   Objective    There were no vitals taken for this visit. {Show previous vital signs (  optional):23777}  Physical Exam  ***  No results found for any visits on 12/11/21.  Assessment & Plan     ***  No follow-ups on file.      {provider attestation***:1}   Lelon Huh, MD  [facility] [phone] (phone) [phone] (fax)  [place]

## 2021-12-11 ENCOUNTER — Ambulatory Visit: Payer: Self-pay | Admitting: Family Medicine

## 2021-12-18 NOTE — Progress Notes (Deleted)
Established patient visit   Patient: Tammy Holt   DOB: 03-16-1955   66 y.o. Female  MRN: [id number] Visit Date: 12/21/2021  Today's healthcare provider: Lelon Huh, MD   No chief complaint on file.  Subjective    HPI  Hypertension, follow-up  BP Readings from Last 3 Encounters:  05/19/21 (!) 151/67  11/21/20 (!) 165/79  02/29/20 (!) 156/77   Wt Readings from Last 3 Encounters:  05/19/21 205 lb 11.2 oz (93.3 kg)  11/21/20 206 lb (93.4 kg)  02/29/20 203 lb (92.1 kg)     She was last seen for hypertension 7 months ago.  Management since that visit includes; Added amlodipine  2.5 mg .  Outside blood pressures are {***enter patient reported home BP readings, or 'not being checked':1}.  Pertinent labs Lab Results  Component Value Date   CHOL 192 11/21/2020   HDL 44 11/21/2020   LDLCALC 128 (H) 11/21/2020   TRIG 109 11/21/2020   CHOLHDL 4.4 11/21/2020   Lab Results  Component Value Date   NA 141 11/21/2020   K 4.9 11/21/2020   CREATININE 0.75 11/21/2020   EGFR 89 11/21/2020   GLUCOSE 108 (H) 11/21/2020     The 10-year ASCVD risk score (Arnett DK, et al., 2019) is: 32.4%  --------------------------------------------------------------------------------------------------- Anxiety, Follow-up  She was last seen for anxiety 7 months ago. Changes made at last visit include; continue venlafaxine 5 mg .   She reports {excellent/good/fair/poor:19665} compliance with treatment. She reports {good/fair/poor:18685} tolerance of treatment. She {is/is not:21021397} having side effects. {document side effects if present:1}  She feels her anxiety is {Desc; severity:60313} and {improved/worse/unchanged:3041574} since last visit.  Symptoms: {Yes/No:20286} chest pain {Yes/No:20286} difficulty concentrating  {Yes/No:20286} dizziness {Yes/No:20286} fatigue  {Yes/No:20286} feelings of losing control {Yes/No:20286} insomnia  {Yes/No:20286} irritable {Yes/No:20286}  palpitations  {Yes/No:20286} panic attacks {Yes/No:20286} racing thoughts  {Yes/No:20286} shortness of breath {Yes/No:20286} sweating  {Yes/No:20286} tremors/shakes    GAD-7 Results     No data to display          PHQ-9 Scores    11/21/2020   10:09 AM 02/29/2020    8:29 AM 02/16/2019   10:54 AM  PHQ9 SCORE ONLY  PHQ-9 Total Score _0 ---------------------------------------------------------------------------------------------------   Medications: Outpatient Medications Prior to Visit  Medication Sig   albuterol (VENTOLIN HFA) 108 (90 Base) MCG/ACT inhaler Inhale 1-2 puffs into the lungs 4 (four) times daily as needed. For wheezing   ALPRAZolam (XANAX) 0.5 MG tablet Take 1 tablet by mouth twice daily as needed for anxiety   amLODipine (NORVASC) 2.5 MG tablet Take 1 tablet by mouth once daily   gabapentin (NEURONTIN) 300 MG capsule Take 1 capsule (300 mg total) by mouth at bedtime.   lisinopril (ZESTRIL) 20 MG tablet Take 1 tablet by mouth once daily   venlafaxine XR (EFFEXOR-XR) 75 MG 24 hr capsule Take 1 capsule by mouth once daily   No facility-administered medications prior to visit.    Review of Systems  Constitutional:  Negative for appetite change, chills, fatigue and fever.  Respiratory:  Negative for chest tightness and shortness of breath.   Cardiovascular:  Negative for chest pain and palpitations.  Gastrointestinal:  Negative for abdominal pain, nausea and vomiting.  Neurological:  Negative for dizziness and weakness.    {Labs  Heme  Chem  Endocrine  Serology  Results Review (optional):23779}   Objective    There were no vitals taken for this visit. {Show previous vital  signs (optional):23777}  Physical Exam  ***  No results found for any visits on 12/21/21.  Assessment & Plan     ***  No follow-ups on file.      {provider attestation***:1}   Lelon Huh, MD  [facility] [phone] (phone) [phone]  (fax)  [place]

## 2021-12-21 ENCOUNTER — Ambulatory Visit: Payer: Self-pay | Admitting: Family Medicine

## 2022-01-11 ENCOUNTER — Other Ambulatory Visit: Payer: Self-pay | Admitting: Family Medicine

## 2022-01-11 DIAGNOSIS — I10 Essential (primary) hypertension: Secondary | ICD-10-CM

## 2022-01-12 NOTE — Telephone Encounter (Signed)
Requested Prescriptions  Pending Prescriptions Disp Refills   lisinopril (ZESTRIL) 20 MG tablet [Pharmacy Med Name: Lisinopril 20 MG Oral Tablet] 90 tablet 0    Sig: Take 1 tablet by mouth once daily     Cardiovascular:  ACE Inhibitors Failed - 01/11/2022 12:49 PM      Failed - Cr in normal range and within 180 days    Creatinine, Ser  Date Value Ref Range Status  11/21/2020 0.75 0.57 - 1.00 mg/dL Final         Failed - K in normal range and within 180 days    Potassium  Date Value Ref Range Status  11/21/2020 4.9 3.5 - 5.2 mmol/L Final         Failed - Last BP in normal range    BP Readings from Last 1 Encounters:  05/19/21 (!) 151/67         Failed - Valid encounter within last 6 months    Recent Outpatient Visits           7 months ago Essential hypertension   [facility] Birdie Sons, MD   1 year ago Essential hypertension   [facility] Birdie Sons, MD   1 year ago Sciatica of left side   [facility] Birdie Sons, MD   2 years ago Chronic pain of left knee   [facility] Birdie Sons, MD   4 years ago COPD exacerbation [facility])   [facility] Jerrol Banana., MD              Passed - Patient is not pregnant

## 2022-02-01 ENCOUNTER — Ambulatory Visit: Payer: Self-pay | Admitting: Family Medicine

## 2022-02-19 ENCOUNTER — Ambulatory Visit (INDEPENDENT_AMBULATORY_CARE_PROVIDER_SITE_OTHER): Payer: Self-pay | Admitting: Family Medicine

## 2022-02-19 VITALS — BP 148/79 | HR 73 | Temp 97.9°F | Wt 204.0 lb

## 2022-02-19 DIAGNOSIS — I10 Essential (primary) hypertension: Secondary | ICD-10-CM

## 2022-02-19 DIAGNOSIS — J45909 Unspecified asthma, uncomplicated: Secondary | ICD-10-CM

## 2022-02-19 DIAGNOSIS — F419 Anxiety disorder, unspecified: Secondary | ICD-10-CM

## 2022-02-19 DIAGNOSIS — J40 Bronchitis, not specified as acute or chronic: Secondary | ICD-10-CM

## 2022-02-19 MED ORDER — PREDNISONE 10 MG PO TABS: ORAL_TABLET | ORAL | 0 refills | Status: AC

## 2022-02-19 MED ORDER — AMLODIPINE BESYLATE 2.5 MG PO TABS: 2.5000 mg | ORAL_TABLET | Freq: Every day | ORAL | 0 refills | Status: DC

## 2022-02-19 MED ORDER — AMOXICILLIN 500 MG PO CAPS: 1000.0000 mg | ORAL_CAPSULE | Freq: Three times a day (TID) | ORAL | 0 refills | Status: AC

## 2022-02-19 MED ORDER — VENLAFAXINE HCL ER 75 MG PO CP24: 75.0000 mg | ORAL_CAPSULE | Freq: Every day | ORAL | 1 refills | Status: DC

## 2022-02-19 MED ORDER — ALBUTEROL SULFATE HFA 108 (90 BASE) MCG/ACT IN AERS: 1.0000 | INHALATION_SPRAY | Freq: Four times a day (QID) | RESPIRATORY_TRACT | 1 refills | Status: DC | PRN

## 2022-02-19 NOTE — Progress Notes (Unsigned)
Established patient visit   Patient: Tammy Holt   DOB: July 29, 1955   67 y.o. Female  MRN: [id number] Visit Date: 02/19/2022  Today's healthcare provider: Lelon Huh, MD   No chief complaint on file.  Subjective    HPI   Hypertension, follow-up  BP Readings from Last 3 Encounters:  02/19/22 (!) 148/79  05/19/21 (!) 151/67  11/21/20 (!) 165/79   Wt Readings from Last 3 Encounters:  02/19/22 204 lb (92.5 kg)  05/19/21 205 lb 11.2 oz (93.3 kg)  11/21/20 206 lb (93.4 kg)     She was last seen for hypertension 9 months ago.  BP at that visit was asa above. Management since that visit includes none.  She reports good compliance with treatment. She is not having side effects.  She is following a Regular diet. She is exercising. She does smoke.  Use of agents associated with hypertension: none.   Outside blood pressures are not recorded. Symptoms: No chest pain No chest pressure  No palpitations No syncope  No dyspnea No orthopnea  No paroxysmal nocturnal dyspnea No lower extremity edema   Pertinent labs Lab Results  Component Value Date   CHOL 192 11/21/2020   HDL 44 11/21/2020   LDLCALC 128 (H) 11/21/2020   TRIG 109 11/21/2020   CHOLHDL 4.4 11/21/2020   Lab Results  Component Value Date   NA 141 11/21/2020   K 4.9 11/21/2020   CREATININE 0.75 11/21/2020   EGFR 89 11/21/2020   GLUCOSE 108 (H) 11/21/2020     The 10-year ASCVD risk score (Arnett DK, et al., 2019) is: 31.3%  --------------------------------------------------------------------------------------------------- Anxiety, Follow-up  She was last seen for anxiety 9 months ago. Changes made at last visit include none.   She reports good compliance with treatment. She reports good tolerance of treatment. She is not having side effects.   She feels her anxiety is mild and Unchanged since last visit.  Symptoms: No chest pain No difficulty concentrating  No dizziness No fatigue  No  feelings of losing control No insomnia  No irritable No palpitations  No panic attacks No racing thoughts  Yes shortness of breath No sweating  No tremors/shakes    GAD-7 Results     No data to display          PHQ-9 Scores    02/19/2022   11:37 AM 11/21/2020   10:09 AM 02/29/2020    8:29 AM  PHQ9 SCORE ONLY  PHQ-9 Total Score 7 11 17    $ --------------------------------------------------------------------------------------------------- URI-patient also complains of respiratory symptoms.  She states she lives in a house with a wood stove and thinks this is aggravating her breathing.  Onset was 2 days ago and she describes productive cough without fever. She reports negative Covid test.  Medications: Outpatient Medications Prior to Visit  Medication Sig   albuterol (VENTOLIN HFA) 108 (90 Base) MCG/ACT inhaler Inhale 1-2 puffs into the lungs 4 (four) times daily as needed. For wheezing   ALPRAZolam (XANAX) 0.5 MG tablet Take 1 tablet by mouth twice daily as needed for anxiety   amLODipine (NORVASC) 2.5 MG tablet Take 1 tablet by mouth once daily   gabapentin (NEURONTIN) 300 MG capsule Take 1 capsule (300 mg total) by mouth at bedtime.   lisinopril (ZESTRIL) 20 MG tablet Take 1 tablet by mouth once daily   venlafaxine XR (EFFEXOR-XR) 75 MG 24 hr capsule Take 1 capsule by mouth once daily   No facility-administered medications prior to  visit.    Review of Systems  Constitutional:  Negative for chills, diaphoresis, fatigue and fever.  HENT:  Positive for congestion, postnasal drip, rhinorrhea and sinus pressure. Negative for ear discharge, ear pain, facial swelling, hearing loss, nosebleeds, sinus pain, sneezing, sore throat, tinnitus, trouble swallowing and voice change.   Eyes:  Negative for photophobia, pain, discharge, redness, itching and visual disturbance.  Respiratory:  Positive for cough, shortness of breath and wheezing.   Cardiovascular:  Negative for chest pain and  palpitations.    {Labs  Heme  Chem  Endocrine  Serology  Results Review (optional):23779}   Objective    BP (!) 148/79 (BP Location: Left Arm, Patient Position: Sitting, Cuff Size: Normal)   Pulse 73   Temp 97.9 F (36.6 C) (Oral)   Wt 204 lb (92.5 kg)   SpO2 97%   BMI 39.84 kg/m  {Show previous vital signs (optional):23777}  Physical Exam  ***  No results found for any visits on 02/19/22.  Assessment & Plan     ***  No follow-ups on file.      {provider attestation***:1}   Lelon Huh, MD  [place] [phone] (phone) [phone] (fax)  [place]

## 2022-02-19 NOTE — Patient Instructions (Signed)
.   Please review the attached list of medications and notify my office if there are any errors.   . Please bring all of your medications to every appointment so we can make sure that our medication list is the same as yours.   

## 2022-03-30 NOTE — Progress Notes (Deleted)
     I,Sha'taria Tyson,acting as a Education administrator for Lelon Huh, MD.,have documented all relevant documentation on the behalf of Lelon Huh, MD,as directed by  Lelon Huh, MD while in the presence of Lelon Huh, MD.     Established patient visit   Patient: Tammy Holt   DOB: [birth year]   67 y.o. Female  MRN: [id number] Visit Date: 03/31/2022  Today's healthcare provider: Lelon Huh, MD   No chief complaint on file.  Subjective    HPI  Hypertension, follow-up  BP Readings from Last 3 Encounters:  02/19/22 (!) 148/79  05/19/21 (!) 151/67  11/21/20 (!) 165/79   Wt Readings from Last 3 Encounters:  02/19/22 204 lb (92.5 kg)  05/19/21 205 lb 11.2 oz (93.3 kg)  11/21/20 206 lb (93.4 kg)     She was last seen for hypertension 10 months ago.  BP at that visit was as above. Management since that visit includes adding NOrvasc 2.5 mg daily.  She reports {excellent/good/fair/poor:19665} compliance with treatment. She {is/is not:9024} having side effects. {document side effects if present:1} She is following a {diet:21022986} diet. She {is/is not:9024} exercising. She {does/does not:200015} smoke.  Use of agents associated with hypertension: {bp agents assoc with hypertension:511::"none"}.   Outside blood pressures are {***enter patient reported home BP readings, or 'not being checked':1}. Symptoms: {Yes/No:20286} chest pain {Yes/No:20286} chest pressure  {Yes/No:20286} palpitations {Yes/No:20286} syncope  {Yes/No:20286} dyspnea {Yes/No:20286} orthopnea  {Yes/No:20286} paroxysmal nocturnal dyspnea {Yes/No:20286} lower extremity edema   Pertinent labs Lab Results  Component Value Date   CHOL 192 11/21/2020   HDL 44 11/21/2020   LDLCALC 128 (H) 11/21/2020   TRIG 109 11/21/2020   CHOLHDL 4.4 11/21/2020   Lab Results  Component Value Date   NA 141 11/21/2020   K 4.9 11/21/2020   CREATININE 0.75 11/21/2020   EGFR 89 11/21/2020   GLUCOSE 108 (H) 11/21/2020      The 10-year ASCVD risk score (Arnett DK, et al., 2019) is: 33.3%  ---------------------------------------------------------------------------------------------------   Medications: Outpatient Medications Prior to Visit  Medication Sig   albuterol (VENTOLIN HFA) 108 (90 Base) MCG/ACT inhaler Inhale 1-2 puffs into the lungs 4 (four) times daily as needed. For wheezing   ALPRAZolam (XANAX) 0.5 MG tablet Take 1 tablet by mouth twice daily as needed for anxiety   amLODipine (NORVASC) 2.5 MG tablet Take 1 tablet (2.5 mg total) by mouth daily.   gabapentin (NEURONTIN) 300 MG capsule Take 1 capsule (300 mg total) by mouth at bedtime.   lisinopril (ZESTRIL) 20 MG tablet Take 1 tablet by mouth once daily   venlafaxine XR (EFFEXOR-XR) 75 MG 24 hr capsule Take 1 capsule (75 mg total) by mouth daily.   No facility-administered medications prior to visit.    Review of Systems  {Labs  Heme  Chem  Endocrine  Serology  Results Review (optional):23779}   Objective    There were no vitals taken for this visit. {Show previous vital signs (optional):23777}  Physical Exam  ***  No results found for any visits on 03/31/22.  Assessment & Plan     ***  No follow-ups on file.      {provider attestation***:1}   Lelon Huh, MD  [place] [phone] (phone) [phone] (fax)  [place]

## 2022-03-31 ENCOUNTER — Ambulatory Visit: Payer: Self-pay | Admitting: Family Medicine

## 2022-04-13 ENCOUNTER — Ambulatory Visit: Payer: Self-pay | Admitting: Family Medicine

## 2022-04-13 NOTE — Progress Notes (Deleted)
I,Sha'taria Braxdon Gappa,acting as a Education administrator for Lelon Huh, MD.,have documented all relevant documentation on the behalf of Lelon Huh, MD,as directed by  Lelon Huh, MD while in the presence of Lelon Huh, MD.   Established patient visit   Patient: Tammy Holt   DOB: [birth year]   67 y.o. Female  MRN: [id number] Visit Date: 04/13/2022  Today's healthcare provider: Lelon Huh, MD   No chief complaint on file.  Subjective    HPI  Hypertension, follow-up  BP Readings from Last 3 Encounters:  02/19/22 (!) 148/79  05/19/21 (!) 151/67  11/21/20 (!) 165/79   Wt Readings from Last 3 Encounters:  02/19/22 204 lb (92.5 kg)  05/19/21 205 lb 11.2 oz (93.3 kg)  11/21/20 206 lb (93.4 kg)     She was last seen for hypertension 7 weeks ago.  BP at that visit was 148/79. Management since that visit includes continue amLODipine (NORVASC) 2.5 MG tablet .  She reports {excellent/good/fair/poor:19665} compliance with treatment. She {is/is not:9024} having side effects. {document side effects if present:1} She is following a {diet:21022986} diet. She {is/is not:9024} exercising. She {does/does not:200015} smoke.  Use of agents associated with hypertension: {bp agents assoc with hypertension:511::"none"}.   Outside blood pressures are {***enter patient reported home BP readings, or 'not being checked':1}. Symptoms: {Yes/No:20286} chest pain {Yes/No:20286} chest pressure  {Yes/No:20286} palpitations {Yes/No:20286} syncope  {Yes/No:20286} dyspnea {Yes/No:20286} orthopnea  {Yes/No:20286} paroxysmal nocturnal dyspnea {Yes/No:20286} lower extremity edema   Pertinent labs Lab Results  Component Value Date   CHOL 192 11/21/2020   HDL 44 11/21/2020   LDLCALC 128 (H) 11/21/2020   TRIG 109 11/21/2020   CHOLHDL 4.4 11/21/2020   Lab Results  Component Value Date   NA 141 11/21/2020   K 4.9 11/21/2020   CREATININE 0.75 11/21/2020   EGFR 89 11/21/2020   GLUCOSE 108 (H)  11/21/2020     The 10-year ASCVD risk score (Arnett DK, et al., 2019) is: 33.3%  ---------------------------------------------------------------------------------------------------  Anxiety, Follow-up  She was last seen for anxiety 7 weeks ago. Changes made at last visit include refill venlafaxine XR (EFFEXOR-XR) 75 MG 24 hr capsule .   She reports {excellent/good/fair/poor:19665} compliance with treatment. She reports {good/fair/poor:18685} tolerance of treatment. She {is/is not:21021397} having side effects. {document side effects if present:1}  She feels her anxiety is {Desc; severity:60313} and {improved/worse/unchanged:3041574} since last visit.  Symptoms: {Yes/No:20286} chest pain {Yes/No:20286} difficulty concentrating  {Yes/No:20286} dizziness {Yes/No:20286} fatigue  {Yes/No:20286} feelings of losing control {Yes/No:20286} insomnia  {Yes/No:20286} irritable {Yes/No:20286} palpitations  {Yes/No:20286} panic attacks {Yes/No:20286} racing thoughts  {Yes/No:20286} shortness of breath {Yes/No:20286} sweating  {Yes/No:20286} tremors/shakes    GAD-7 Results     No data to display          PHQ-9 Scores    02/19/2022   11:37 AM 11/21/2020   10:09 AM 02/29/2020    8:29 AM  PHQ9 SCORE ONLY  PHQ-9 Total Score 7 11 17     ---------------------------------------------------------------------------------------------------   Medications: Outpatient Medications Prior to Visit  Medication Sig   albuterol (VENTOLIN HFA) 108 (90 Base) MCG/ACT inhaler Inhale 1-2 puffs into the lungs 4 (four) times daily as needed. For wheezing   ALPRAZolam (XANAX) 0.5 MG tablet Take 1 tablet by mouth twice daily as needed for anxiety   amLODipine (NORVASC) 2.5 MG tablet Take 1 tablet (2.5 mg total) by mouth daily.   gabapentin (NEURONTIN) 300 MG capsule Take 1 capsule (300 mg total) by mouth at bedtime.   lisinopril (ZESTRIL) 20 MG  tablet Take 1 tablet by mouth once daily   venlafaxine XR  (EFFEXOR-XR) 75 MG 24 hr capsule Take 1 capsule (75 mg total) by mouth daily.   No facility-administered medications prior to visit.    Review of Systems  {Labs  Heme  Chem  Endocrine  Serology  Results Review (optional):23779}   Objective    There were no vitals taken for this visit. {Show previous vital signs (optional):23777}  Physical Exam  ***  No results found for any visits on 04/13/22.  Assessment & Plan     ***  No follow-ups on file.      {provider attestation***:1}   Lelon Huh, MD  [place] [phone] (phone) [phone] (fax)  [place]

## 2022-04-29 ENCOUNTER — Other Ambulatory Visit: Payer: Self-pay | Admitting: Family Medicine

## 2022-04-29 DIAGNOSIS — I10 Essential (primary) hypertension: Secondary | ICD-10-CM

## 2022-08-20 ENCOUNTER — Ambulatory Visit (INDEPENDENT_AMBULATORY_CARE_PROVIDER_SITE_OTHER): Payer: Self-pay | Admitting: Family Medicine

## 2022-08-20 ENCOUNTER — Encounter: Payer: Self-pay | Admitting: Family Medicine

## 2022-08-20 VITALS — BP 159/61 | HR 67 | Temp 97.6°F | Ht 60.0 in | Wt 213.7 lb

## 2022-08-20 DIAGNOSIS — I10 Essential (primary) hypertension: Secondary | ICD-10-CM

## 2022-08-20 DIAGNOSIS — F419 Anxiety disorder, unspecified: Secondary | ICD-10-CM

## 2022-08-20 DIAGNOSIS — Z1159 Encounter for screening for other viral diseases: Secondary | ICD-10-CM

## 2022-08-20 DIAGNOSIS — J45909 Unspecified asthma, uncomplicated: Secondary | ICD-10-CM

## 2022-08-20 DIAGNOSIS — F17210 Nicotine dependence, cigarettes, uncomplicated: Secondary | ICD-10-CM

## 2022-08-20 DIAGNOSIS — F32A Depression, unspecified: Secondary | ICD-10-CM

## 2022-08-20 DIAGNOSIS — J40 Bronchitis, not specified as acute or chronic: Secondary | ICD-10-CM

## 2022-08-20 DIAGNOSIS — Z86010 Personal history of colonic polyps: Secondary | ICD-10-CM

## 2022-08-20 DIAGNOSIS — R7303 Prediabetes: Secondary | ICD-10-CM | POA: Diagnosis not present

## 2022-08-20 DIAGNOSIS — Z1211 Encounter for screening for malignant neoplasm of colon: Secondary | ICD-10-CM

## 2022-08-20 DIAGNOSIS — J449 Chronic obstructive pulmonary disease, unspecified: Secondary | ICD-10-CM

## 2022-08-20 MED ORDER — AMOXICILLIN 500 MG PO CAPS: 1000.0000 mg | ORAL_CAPSULE | Freq: Two times a day (BID) | ORAL | 0 refills | Status: AC

## 2022-08-20 MED ORDER — LISINOPRIL 20 MG PO TABS
20.0000 mg | ORAL_TABLET | Freq: Every day | ORAL | 1 refills | Status: DC
Start: 2022-08-20 — End: 2023-02-10

## 2022-08-20 MED ORDER — TRELEGY ELLIPTA 100-62.5-25 MCG/ACT IN AEPB
1.0000 | INHALATION_SPRAY | Freq: Every day | RESPIRATORY_TRACT | Status: AC
Start: 2022-08-20 — End: ?

## 2022-08-20 MED ORDER — VENLAFAXINE HCL ER 75 MG PO CP24
75.0000 mg | ORAL_CAPSULE | Freq: Every day | ORAL | 2 refills | Status: DC
Start: 2022-08-20 — End: 2023-11-02

## 2022-08-20 MED ORDER — AMLODIPINE BESYLATE 2.5 MG PO TABS
2.5000 mg | ORAL_TABLET | Freq: Every day | ORAL | 3 refills | Status: DC
Start: 2022-08-20 — End: 2023-11-02

## 2022-08-20 NOTE — Progress Notes (Signed)
Established patient visit   Patient: Tammy Holt   DOB: Jul 12, 1955   67 y.o. Female  MRN: [id number] Visit Date: 08/20/2022  Today's healthcare provider: Mila Merry, MD   Chief Complaint  Patient presents with   Hypertension   URI   Subjective    Hypertension Associated symptoms include shortness of breath. Pertinent negatives include no chest pain or palpitations.  URI  Associated symptoms include coughing and wheezing. Pertinent negatives include no abdominal pain, chest pain, nausea or vomiting.   HPI   HTN-patient is not checking her BP at home. Patient reports good compliance and tolerance with medication.  Patient reports head cold x a couple of days. She reports negative COVID test. She reports cough, yellow phlegm, poor appetite, scratchy throat, low grade fever at night, shortness of breath, and wheezing. She is taking OTC Mucinex sore throat pain relief.  Last edited by Myles Lipps, CMA on 08/20/2022 10:15 AM.    Discussed the use of AI scribe software for clinical note transcription with the patient, who gave verbal consent to proceed.  History of Present Illness   Tammy Holt, a patient with a history of hypertension and bronchitis, presents for a follow-up visit primarily concerning her blood pressure. She reports no recent use of cold medicines, sinus decongestants, or Sudafed due to concerns about their potential impact on her blood pressure. She has not been monitoring her blood pressure at home. She denies experiencing chest pains, heart flutter, shortness of breath, or swelling in her hands or feet.  She has been taking ibuprofen, but reports running out of her blood pressure medication a couple of days prior to the visit. She also mentions a wheezing sound when breathing, which she has noticed herself. She has been coughing up yellowish sputum, which started about three days ago. She tested negative for COVID-19.  In addition to her blood  pressure medication, she is also on an antidepressant, Effexor, which she reports is working well for her. She uses an albuterol inhaler for her bronchitis and requests a refill. She recently started receiving Medicare benefits.  She has history of asthma and likely COPD on no maintenance inhalers.   She has a history of smoking and has not had a lung scan for precancerous nodules. She is due for routine blood work to monitor her sugar levels and kidney function. [facility] current [facility] [address].       Medications: Outpatient Medications Prior to Visit  Medication Sig   albuterol (VENTOLIN HFA) 108 (90 Base) MCG/ACT inhaler Inhale 1-2 puffs into the lungs 4 (four) times daily as needed. For wheezing   ALPRAZolam (XANAX) 0.5 MG tablet Take 1 tablet by mouth twice daily as needed for anxiety   amLODipine (NORVASC) 2.5 MG tablet Take 1 tablet (2.5 mg total) by mouth daily.   lisinopril (ZESTRIL) 20 MG tablet Take 1 tablet by mouth once daily   venlafaxine XR (EFFEXOR-XR) 75 MG 24 hr capsule Take 1 capsule (75 mg total) by mouth daily.   gabapentin (NEURONTIN) 300 MG capsule Take 1 capsule (300 mg total) by mouth at bedtime. (Patient not taking: Reported on 08/20/2022)   No facility-administered medications prior to visit.    Review of Systems  Constitutional:  Negative for appetite change, chills, fatigue and fever.  Respiratory:  Positive for cough, shortness of breath and wheezing. Negative for chest tightness.   Cardiovascular:  Negative for chest pain and palpitations.  Gastrointestinal:  Negative  for abdominal pain, nausea and vomiting.  Neurological:  Negative for dizziness and weakness.      Objective    BP (!) 159/61 (BP Location: Left Arm, Patient Position: Sitting, Cuff Size: Large)   Pulse 67   Temp 97.6 F (36.4 C) (Temporal)   Ht 5' (1.524 m)   Wt 213 lb 11.2 oz (96.9 kg)   SpO2 97%   BMI 41.74 kg/m   Physical Exam   General: Appearance:    Severely  obese female in no acute distress  Eyes:    PERRL, conjunctiva/corneas clear, EOM's intact       Lungs:     Mild diffuse wheezing, no rales or rhonchi, respirations unlabored  Heart:    Normal heart rate. Normal rhythm. No murmurs, rubs, or gallops.    MS:   All extremities are intact.    Neurologic:   Awake, alert, oriented x 3. No apparent focal neurological defect.          Assessment & Plan     Assessment and Plan    Hypertension Elevated blood pressure (148/76) in the setting of missed antihypertensive medication for a couple of days. No symptoms of end-organ damage. -Refill antihypertensive medication.  Bronchitis Wheezing and productive cough with yellow sputum for the past three days. No fever or dyspnea. -Start Amoxicillin.   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 If effective, will prescribe.  Depression Stable on Effexor. -Refill Effexor.  Asthma Request for Albuterol inhaler refill. -Refill Albuterol inhaler.  > 20 pack year active smoker.  -Order lung cancer screening scan due to smoking history. -Order labs for routine monitoring of blood glucose and kidney function.          Mila Merry, MD  [facility] [phone] (phone) [phone] (fax)  [facility]

## 2022-08-20 NOTE — Patient Instructions (Signed)
Please review the attached list of medications and notify my office if there are any errors.  ? ?Please call the [facility] ([phone]) to schedule a routine screening mammogram. ? ?

## 2022-08-21 LAB — HEPATITIS C ANTIBODY: Hep C Virus Ab: NONREACTIVE

## 2022-08-23 ENCOUNTER — Telehealth: Payer: Self-pay

## 2022-08-23 NOTE — Telephone Encounter (Signed)
Pt called for lab results. Shared provider's note. No questions at this time.  [name], MD 08/22/2022  9:39 AM EDT Back to Top    Your A1c is up a bit to 6.9. the rest of your labs are normal please try to avoid sweets and white starchy foods as much as possible, and schedule a follow up appointment.in mid December to check on the A1c.   Dr Sherrie Mustache

## 2022-08-25 ENCOUNTER — Telehealth: Payer: Self-pay | Admitting: *Deleted

## 2022-08-25 ENCOUNTER — Other Ambulatory Visit: Payer: Self-pay | Admitting: *Deleted

## 2022-08-25 DIAGNOSIS — Z86010 Personal history of colonic polyps: Secondary | ICD-10-CM

## 2022-08-25 MED ORDER — PEG 3350-KCL-NABCB-NACL-NASULF 236 G PO SOLR: 4000.0000 mL | Freq: Once | ORAL | 0 refills | Status: AC

## 2022-08-25 NOTE — Telephone Encounter (Signed)
Gastroenterology Pre-Procedure Review  Request Date: 10/15/2022 Requesting Physician: Dr. Tobi Bastos  PATIENT REVIEW QUESTIONS: The patient responded to the following health history questions as indicated:    1. Are you having any GI issues? no 2. Do you have a personal history of Polyps? yes (about 10 years ago) 3. Do you have a family history of Colon Cancer or Polyps? no 4. Diabetes Mellitus? no 5. Joint replacements in the past 12 months?no 6. Major health problems in the past 3 months?no 7. Any artificial heart valves, MVP, or defibrillator?no    MEDICATIONS & ALLERGIES:    Patient reports the following regarding taking any anticoagulation/antiplatelet therapy:   Plavix, Coumadin, Eliquis, Xarelto, Lovenox, Pradaxa, Brilinta, or Effient? no Aspirin? no  Patient confirms/reports the following medications:  Current Outpatient Medications  Medication Sig Dispense Refill   albuterol (VENTOLIN HFA) 108 (90 Base) MCG/ACT inhaler Inhale 1-2 puffs into the lungs 4 (four) times daily as needed. For wheezing 8 g 1   ALPRAZolam (XANAX) 0.5 MG tablet Take 1 tablet by mouth twice daily as needed for anxiety 30 tablet 1   amLODipine (NORVASC) 2.5 MG tablet Take 1 tablet (2.5 mg total) by mouth daily. 90 tablet 3   amoxicillin (AMOXIL) 500 MG capsule Take 2 capsules (1,000 mg total) by mouth 2 (two) times daily for 10 days. 40 capsule 0   Fluticasone-Umeclidin-Vilant (TRELEGY ELLIPTA) 100-62.5-25 MCG/ACT AEPB Inhale 1 puff into the lungs daily.     lisinopril (ZESTRIL) 20 MG tablet Take 1 tablet (20 mg total) by mouth daily. 90 tablet 1   venlafaxine XR (EFFEXOR-XR) 75 MG 24 hr capsule Take 1 capsule (75 mg total) by mouth daily. 90 capsule 2   No current facility-administered medications for this visit.    Patient confirms/reports the following allergies:  Allergies  Allergen Reactions   Latex Rash    REACTION: Hives, Blisters    No orders of the defined types were placed in this  encounter.   AUTHORIZATION INFORMATION Primary Insurance: 1D#: Group #:  Secondary Insurance: 1D#: Group #:  SCHEDULE INFORMATION: Date: 10/15/2022 Time: Location: [facility]

## 2022-10-03 DIAGNOSIS — H1131 Conjunctival hemorrhage, right eye: Secondary | ICD-10-CM | POA: Diagnosis not present

## 2022-10-05 ENCOUNTER — Other Ambulatory Visit: Payer: Self-pay | Admitting: Ophthalmology

## 2022-10-05 DIAGNOSIS — H5340 Unspecified visual field defects: Secondary | ICD-10-CM

## 2022-10-08 ENCOUNTER — Ambulatory Visit
Admission: RE | Admit: 2022-10-08 | Discharge: 2022-10-08 | Disposition: A | Discharge status: Home / Self Care | Payer: Medicare Other | Source: Ambulatory Visit | Attending: Ophthalmology | Admitting: Ophthalmology

## 2022-10-08 DIAGNOSIS — H53459 Other localized visual field defect, unspecified eye: Secondary | ICD-10-CM | POA: Diagnosis not present

## 2022-10-08 DIAGNOSIS — G9389 Other specified disorders of brain: Secondary | ICD-10-CM | POA: Diagnosis not present

## 2022-10-08 DIAGNOSIS — H5340 Unspecified visual field defects: Secondary | ICD-10-CM | POA: Diagnosis not present

## 2022-10-08 MED ORDER — GADOBUTROL 1 MMOL/ML IV SOLN
9.0000 mL | Freq: Once | INTRAVENOUS | Status: AC | PRN
  Administered 2022-10-08: 9 mL via INTRAVENOUS

## 2022-10-15 ENCOUNTER — Telehealth: Payer: Self-pay | Admitting: Family Medicine

## 2022-10-15 NOTE — Telephone Encounter (Signed)
[facility] called and spoke to [place], Pensions consultant about the refill(s) venlafaxine requested. Advised it was sent on 08/20/22 #90/1 refill(s). She says it's available for refill today and patient will receive a notification.

## 2022-10-15 NOTE — Telephone Encounter (Signed)
Medication Refill - Medication: venlafaxine XR (EFFEXOR-XR) 75 MG 24 hr capsule [[id number]]   Has the patient contacted their pharmacy? Yes.     (Agent: If yes, when and what did the pharmacy advise?) Contact PCP  Preferred Pharmacy (with phone number or street name): [facility] [address]   Has the patient been seen for an appointment in the last year OR does the patient have an upcoming appointment? Yes.    Agent: Please be advised that RX refills may take up to 3 business days. We ask that you follow-up with your pharmacy.   Pt is completely out of medication as of this morning.

## 2022-11-07 ENCOUNTER — Other Ambulatory Visit: Payer: Self-pay | Admitting: Family Medicine

## 2022-11-07 DIAGNOSIS — J45909 Unspecified asthma, uncomplicated: Secondary | ICD-10-CM

## 2022-11-26 ENCOUNTER — Encounter: Payer: Self-pay | Admitting: Gastroenterology

## 2022-11-26 ENCOUNTER — Encounter
Admission: RE | Disposition: A | Discharge status: Home / Self Care | Payer: Self-pay | Source: Home / Self Care | Attending: Gastroenterology

## 2022-11-26 ENCOUNTER — Ambulatory Visit: Payer: Medicare Other | Admitting: Anesthesiology

## 2022-11-26 ENCOUNTER — Ambulatory Visit
Admission: RE | Admit: 2022-11-26 | Discharge: 2022-11-26 | Disposition: A | Discharge status: Home / Self Care | Payer: Medicare Other | Attending: Gastroenterology | Admitting: Gastroenterology

## 2022-11-26 DIAGNOSIS — Z860100 Personal history of colon polyps, unspecified: Secondary | ICD-10-CM | POA: Diagnosis not present

## 2022-11-26 DIAGNOSIS — Z6841 Body Mass Index (BMI) 40.0 and over, adult: Secondary | ICD-10-CM | POA: Insufficient documentation

## 2022-11-26 DIAGNOSIS — K219 Gastro-esophageal reflux disease without esophagitis: Secondary | ICD-10-CM | POA: Diagnosis not present

## 2022-11-26 DIAGNOSIS — J4489 Other specified chronic obstructive pulmonary disease: Secondary | ICD-10-CM | POA: Insufficient documentation

## 2022-11-26 DIAGNOSIS — K5730 Diverticulosis of large intestine without perforation or abscess without bleeding: Secondary | ICD-10-CM | POA: Insufficient documentation

## 2022-11-26 DIAGNOSIS — F17210 Nicotine dependence, cigarettes, uncomplicated: Secondary | ICD-10-CM | POA: Diagnosis not present

## 2022-11-26 DIAGNOSIS — K640 First degree hemorrhoids: Secondary | ICD-10-CM | POA: Diagnosis not present

## 2022-11-26 DIAGNOSIS — D122 Benign neoplasm of ascending colon: Secondary | ICD-10-CM | POA: Insufficient documentation

## 2022-11-26 DIAGNOSIS — F17200 Nicotine dependence, unspecified, uncomplicated: Secondary | ICD-10-CM | POA: Insufficient documentation

## 2022-11-26 DIAGNOSIS — D124 Benign neoplasm of descending colon: Secondary | ICD-10-CM | POA: Insufficient documentation

## 2022-11-26 DIAGNOSIS — D120 Benign neoplasm of cecum: Secondary | ICD-10-CM | POA: Diagnosis not present

## 2022-11-26 DIAGNOSIS — Z1211 Encounter for screening for malignant neoplasm of colon: Secondary | ICD-10-CM | POA: Diagnosis not present

## 2022-11-26 DIAGNOSIS — I10 Essential (primary) hypertension: Secondary | ICD-10-CM | POA: Insufficient documentation

## 2022-11-26 DIAGNOSIS — K635 Polyp of colon: Secondary | ICD-10-CM | POA: Insufficient documentation

## 2022-11-26 DIAGNOSIS — D126 Benign neoplasm of colon, unspecified: Secondary | ICD-10-CM

## 2022-11-26 HISTORY — PX: HEMOSTASIS CLIP PLACEMENT: SHX6857

## 2022-11-26 HISTORY — PX: POLYPECTOMY: SHX5525

## 2022-11-26 HISTORY — PX: COLONOSCOPY WITH PROPOFOL: SHX5780

## 2022-11-26 SURGERY — COLONOSCOPY WITH PROPOFOL
Anesthesia: General

## 2022-11-26 MED ORDER — SODIUM CHLORIDE 0.9 % IV SOLN: INTRAVENOUS | Status: DC

## 2022-11-26 MED ORDER — PROPOFOL 10 MG/ML IV BOLUS
INTRAVENOUS | Status: DC | PRN
  Administered 2022-11-26: 20 mg via INTRAVENOUS
  Administered 2022-11-26: 50 mg via INTRAVENOUS
  Administered 2022-11-26 (×9): 20 mg via INTRAVENOUS

## 2022-11-26 MED ORDER — LIDOCAINE HCL (PF) 2 % IJ SOLN
INTRAMUSCULAR | Status: DC | PRN
Start: 2022-11-26 — End: 2022-11-26
  Administered 2022-11-26: 60 mg via INTRADERMAL

## 2022-11-26 NOTE — H&P (Signed)
Wyline Mood, MD [address], [place], [place], [address] [address], [address], [place], [place], [address] Phone: [phone]  Fax: [phone]  Primary Care Physician:  [name], MD   Pre-Procedure History & Physical: HPI:  Tammy Holt is a 67 y.o. female is here for an colonoscopy.   Past Medical History:  Diagnosis Date   Asthma    GERD (gastroesophageal reflux disease)    History of chicken pox    Hyperlipidemia    Hypertension    Panic disorder     Past Surgical History:  Procedure Laterality Date   ABDOMINAL HYSTERECTOMY  01/12/1988   Vaginal. Partial. Due to cervical cancer. One ovary resected + right cyst   Abdominal Ultrasound  01/11/2009   large incisional hernia and ovarian cyst   APPENDECTOMY     CHOLECYSTECTOMY  01/12/1988   COLECTOMY  03/11/2009   partial right colectomy for multiple polyps- all beninign (incidental appendectomy)   COLONOSCOPY  01/12/2008   multiple colon polyps with single large polyp, internal hemorrhoids. Dr. Niel Hummer. Referral to Genernal surgery for resection   HERNIA REPAIR  09/12/2010   Incisional hernia repair    Prior to Admission medications   Medication Sig Start Date End Date Taking? Authorizing Provider  amLODipine (NORVASC) 2.5 MG tablet Take 1 tablet (2.5 mg total) by mouth daily. 08/20/22  Yes [name], MD  Fluticasone-Umeclidin-Vilant (TRELEGY ELLIPTA) 100-62.5-25 MCG/ACT AEPB Inhale 1 puff into the lungs daily. 08/20/22  Yes [name], MD  lisinopril (ZESTRIL) 20 MG tablet Take 1 tablet (20 mg total) by mouth daily. 08/20/22  Yes [name], MD  venlafaxine XR (EFFEXOR-XR) 75 MG 24 hr capsule Take 1 capsule (75 mg total) by mouth daily. 08/20/22  Yes [name], MD  albuterol (VENTOLIN HFA) 108 (90 Base) MCG/ACT inhaler INHALE 1 TO 2 PUFFS BY MOUTH 4 TIMES DAILY AS NEEDED FOR WHEEZING 11/08/22   [name], MD  ALPRAZolam Prudy Feeler) 0.5 MG tablet Take 1 tablet by mouth  twice daily as needed for anxiety 11/25/21   [name], MD    Allergies as of 08/25/2022 - Review Complete 08/20/2022  Allergen Reaction Noted   Latex Rash 11/25/2009    Family History  Problem Relation Age of Onset   Diabetes Mother    Stroke Mother    Depression Mother    Alcohol abuse Father    Cirrhosis Father        of the liver   Colon cancer Sister    Anxiety disorder Sister    Lupus Sister    COPD Sister     Social History   Socioeconomic History   Marital status: Divorced    Spouse name: Not on file   Number of children: 3   Years of education: Not on file   Highest education level: Not on file  Occupational History   Occupation: Unemployed    Comment: looking for work. Previously employed at [employer] in [employer] for 10 years. Was fired due to having surgery and patient no longer able to do assigned tasks  Tobacco Use   Smoking status: Every Day    Current packs/day: 1.00    Average packs/day: 1 pack/day for 40.0 years (40.0 ttl pk-yrs)    Types: Cigarettes   Smokeless tobacco: Never   Tobacco comments:    started at age 14 1/2-1 ppd  Vaping Use   Vaping status: Never Used  Substance and  Sexual Activity   Alcohol use: Yes    Alcohol/week: 0.0 standard drinks of alcohol    Comment: occasionally   Drug use: No   Sexual activity: Not on file  Other Topics Concern   Not on file  Social History Narrative   Not on file   Social Determinants of Health   Financial Resource Strain: Not on file  Food Insecurity: Not on file  Transportation Needs: Not on file  Physical Activity: Not on file  Stress: Not on file  Social Connections: Not on file  Intimate Partner Violence: Not on file    Review of Systems: See HPI, otherwise negative ROS  Physical Exam: BP (!) 186/73   Pulse 85   Temp (!) 97.1 F (36.2 C) (Temporal)   Resp 18   Ht 5' (1.524 m)   Wt 95 kg   SpO2 96%   BMI 40.90 kg/m  General:   Alert,  pleasant and cooperative in  NAD Head:  Normocephalic and atraumatic. Neck:  Supple; no masses or thyromegaly. Lungs:  Clear throughout to auscultation, normal respiratory effort.    Heart:  +S1, +S2, Regular rate and rhythm, No edema. Abdomen:  Soft, nontender and nondistended. Normal bowel sounds, without guarding, and without rebound.   Neurologic:  Alert and  oriented x4;  grossly normal neurologically.  Impression/Plan: Tammy Holt is here for an colonoscopy to be performed for Screening colonoscopy average risk   Risks, benefits, limitations, and alternatives regarding  colonoscopy have been reviewed with the patient.  Questions have been answered.  All parties agreeable.   Wyline Mood, MD  11/26/2022, 7:37 AM

## 2022-11-26 NOTE — Op Note (Signed)
[facility] Patient Name: Tammy Holt Procedure Date: 11/26/2022 7:19 AM MRN: [id number] Account #: [id number] Date of Birth: 1955/05/22 Admit Type: Outpatient Age: 67 Room: [facility] ENDO ROOM 1 Gender: Female Note Status: Finalized Instrument Name: Nelda Marseille [id number] Procedure:             Colonoscopy Indications:           Screening for colorectal malignant neoplasm Providers:             Wyline Mood MD, MD Referring MD:          Demetrios Isaacs. Sherrie Mustache, MD (Referring MD) Medicines:             Monitored Anesthesia Care Complications:         No immediate complications. Procedure:             Pre-Anesthesia Assessment:                        - Prior to the procedure, a History and Physical was                         performed, and patient medications, allergies and                         sensitivities were reviewed. The patient's tolerance                         of previous anesthesia was reviewed.                        - The risks and benefits of the procedure and the                         sedation options and risks were discussed with the                         patient. All questions were answered and informed                         consent was obtained.                        - ASA Grade Assessment: II - A patient with mild                         systemic disease.                        After obtaining informed consent, the colonoscope was                         passed under direct vision. Throughout the procedure,                         the patient's blood pressure, pulse, and oxygen                         saturations were monitored continuously. The                         Colonoscope was  introduced through the anus and                         advanced to the the cecum, identified by the                         appendiceal orifice. The colonoscopy was performed                         with ease. The patient tolerated the procedure well.                          The quality of the bowel preparation was excellent.                         The ileocecal valve, appendiceal orifice, and rectum                         were photographed. Findings:      The perianal and digital rectal examinations were normal.      Four sessile polyps were found in the descending colon, ascending colon       and cecum. The polyps were 6 to 8 mm in size. These polyps were removed       with a cold snare. Resection and retrieval were complete.      Seven sessile polyps were found in the sigmoid colon. The polyps were 5       to 7 mm in size. These polyps were removed with a cold snare. Resection       and retrieval were complete. To prevent bleeding after the polypectomy,       one hemostatic clip was successfully placed. Clip manufacturer: [employer]. There was no bleeding at the end of the procedure.      Multiple small-mouthed diverticula were found in the sigmoid colon.      Non-bleeding internal hemorrhoids were found during retroflexion. The       hemorrhoids were medium-sized and Grade I (internal hemorrhoids that do       not prolapse).      The exam was otherwise without abnormality on direct and retroflexion       views. Impression:            - Four 6 to 8 mm polyps in the descending colon, in                         the ascending colon and in the cecum, removed with a                         cold snare. Resected and retrieved.                        - Seven 5 to 7 mm polyps in the sigmoid colon, removed                         with a cold snare. Resected and retrieved. Clip was                         placed. Clip manufacturer: [employer].                        -  Diverticulosis in the sigmoid colon.                        - Non-bleeding internal hemorrhoids.                        - The examination was otherwise normal on direct and                         retroflexion views. Recommendation:        - Discharge  patient to home (with escort).                        - Resume previous diet.                        - Continue present medications.                        - Await pathology results.                        - Repeat colonoscopy in 1 year for surveillance. Procedure Code(s):     --- Professional ---                        [phone], Colonoscopy, flexible; with removal of                         tumor(s), polyp(s), or other lesion(s) by snare                         technique Diagnosis Code(s):     --- Professional ---                        Z12.11, Encounter for screening for malignant neoplasm                         of colon                        D12.4, Benign neoplasm of descending colon                        D12.2, Benign neoplasm of ascending colon                        D12.0, Benign neoplasm of cecum                        D12.5, Benign neoplasm of sigmoid colon                        K64.0, First degree hemorrhoids                        K57.30, Diverticulosis of large intestine without                         perforation or abscess without bleeding CPT copyright 2022 American Medical Association. All rights reserved. The codes documented in this report are preliminary and upon coder review may  be revised to meet current compliance requirements. Wyline Mood, MD Wyline Mood MD, MD 11/26/2022 8:08:50 AM This report has been signed electronically. Number of Addenda: 0 Note Initiated On: 11/26/2022 7:19 AM Scope Withdrawal Time: 0 hours 17 minutes 5 seconds  Total Procedure Duration: 0 hours 21 minutes 26 seconds  Estimated Blood Loss:  Estimated blood loss: none.      [facility]

## 2022-11-26 NOTE — Anesthesia Postprocedure Evaluation (Signed)
Anesthesia Post Note  Patient: NUVIA UHR  Procedure(s) Performed: COLONOSCOPY WITH PROPOFOL HEMOSTASIS CLIP PLACEMENT POLYPECTOMY  Patient location during evaluation: PACU Anesthesia Type: General Level of consciousness: awake and awake and alert Pain management: satisfactory to patient Vital Signs Assessment: post-procedure vital signs reviewed and stable Respiratory status: spontaneous breathing Cardiovascular status: stable Anesthetic complications: no   No notable events documented.   Last Vitals:  Vitals:   11/26/22 0821 11/26/22 0833  BP:  (!) 163/77  Pulse: 68 70  Resp: 16 14  Temp:    SpO2: 97% 99%    Last Pain:  Vitals:   11/26/22 0821  TempSrc:   PainSc: 0-No pain                 VAN STAVEREN,Lousie Calico

## 2022-11-26 NOTE — Anesthesia Preprocedure Evaluation (Signed)
Anesthesia Evaluation  Patient identified by MRN, date of birth, ID band Patient awake    Reviewed: Allergy & Precautions, NPO status , Patient's Chart, lab work & pertinent test results  Airway Mallampati: III  TM Distance: <3 FB Neck ROM: full    Dental  (+) Upper Dentures, Partial Lower   Pulmonary neg pulmonary ROS, COPD,  COPD inhaler, Current SmokerPatient did not abstain from smoking.   Pulmonary exam normal  + decreased breath sounds      Cardiovascular Exercise Tolerance: Good hypertension, Pt. on medications negative cardio ROS Normal cardiovascular exam Rhythm:Regular Rate:Normal     Neuro/Psych  Headaches  Anxiety     negative neurological ROS  negative psych ROS   GI/Hepatic negative GI ROS, Neg liver ROS,GERD  Medicated,,  Endo/Other  negative endocrine ROS  Class 3 obesity  Renal/GU negative Renal ROS  negative genitourinary   Musculoskeletal   Abdominal  (+) + obese  Peds negative pediatric ROS (+)  Hematology negative hematology ROS (+)   Anesthesia Other Findings Past Medical History: No date: Asthma No date: GERD (gastroesophageal reflux disease) No date: History of chicken pox No date: Hyperlipidemia No date: Hypertension No date: Panic disorder  Past Surgical History: 01/12/1988: ABDOMINAL HYSTERECTOMY     Comment:  Vaginal. Partial. Due to cervical cancer. One ovary               resected + right cyst 01/11/2009: Abdominal Ultrasound     Comment:  large incisional hernia and ovarian cyst No date: APPENDECTOMY 01/12/1988: CHOLECYSTECTOMY 03/11/2009: COLECTOMY     Comment:  partial right colectomy for multiple polyps- all               beninign (incidental appendectomy) 01/12/2008: COLONOSCOPY     Comment:  multiple colon polyps with single large polyp, internal               hemorrhoids. Dr. Niel Hummer. Referral to Genernal surgery               for resection 09/12/2010: HERNIA  REPAIR     Comment:  Incisional hernia repair     Reproductive/Obstetrics negative OB ROS                             Anesthesia Physical Anesthesia Plan  ASA: 3  Anesthesia Plan: General   Post-op Pain Management:    Induction: Intravenous  PONV Risk Score and Plan: Propofol infusion and TIVA  Airway Management Planned: Natural Airway and Nasal Cannula  Additional Equipment:   Intra-op Plan:   Post-operative Plan:   Informed Consent: I have reviewed the patients History and Physical, chart, labs and discussed the procedure including the risks, benefits and alternatives for the proposed anesthesia with the patient or authorized representative who has indicated his/her understanding and acceptance.     Dental Advisory Given  Plan Discussed with: CRNA and Surgeon  Anesthesia Plan Comments:        Anesthesia Quick Evaluation

## 2022-11-26 NOTE — Transfer of Care (Signed)
Immediate Anesthesia Transfer of Care Note  Patient: Tammy Holt  Procedure(s) Performed: COLONOSCOPY WITH PROPOFOL  Patient Location: PACU and Endoscopy Unit  Anesthesia Type:MAC  Level of Consciousness: drowsy  Airway & Oxygen Therapy: Patient Spontanous Breathing and Patient connected to face mask oxygen  Post-op Assessment: Report given to RN and Post -op Vital signs reviewed and stable  Post vital signs: Reviewed and stable  Last Vitals:  Vitals Value Taken Time  BP 102/53 11/26/22 0810  Temp 36 C 11/26/22 0809  Pulse 70 11/26/22 0812  Resp 16 11/26/22 0812  SpO2 97 % 11/26/22 0812  Vitals shown include unfiled device data.  Last Pain:  Vitals:   11/26/22 0809  TempSrc: Temporal  PainSc: Asleep         Complications: No notable events documented.

## 2022-11-29 ENCOUNTER — Encounter: Payer: Self-pay | Admitting: Gastroenterology

## 2022-11-29 LAB — SURGICAL PATHOLOGY

## 2022-12-02 ENCOUNTER — Encounter: Payer: Self-pay | Admitting: Gastroenterology

## 2022-12-03 ENCOUNTER — Telehealth: Payer: Self-pay | Admitting: Family Medicine

## 2022-12-24 ENCOUNTER — Ambulatory Visit: Payer: Self-pay | Admitting: Family Medicine

## 2023-02-09 ENCOUNTER — Other Ambulatory Visit: Payer: Self-pay | Admitting: Family Medicine

## 2023-02-09 DIAGNOSIS — I10 Essential (primary) hypertension: Secondary | ICD-10-CM

## 2023-02-10 NOTE — Telephone Encounter (Signed)
Patient will need an office visit for further refills. Requested Prescriptions  Pending Prescriptions Disp Refills   lisinopril (ZESTRIL) 20 MG tablet [Pharmacy Med Name: Lisinopril 20 MG Oral Tablet] 90 tablet 0    Sig: Take 1 tablet by mouth once daily     Cardiovascular:  ACE Inhibitors Failed - 02/10/2023  2:45 PM      Failed - Last BP in normal range    BP Readings from Last 1 Encounters:  11/26/22 (!) 163/77         Passed - Cr in normal range and within 180 days    Creatinine, Ser  Date Value Ref Range Status  08/20/2022 0.78 0.57 - 1.00 mg/dL Final         Passed - K in normal range and within 180 days    Potassium  Date Value Ref Range Status  08/20/2022 4.5 3.5 - 5.2 mmol/L Final         Passed - Patient is not pregnant      Passed - Valid encounter within last 6 months    Recent Outpatient Visits           5 months ago Essential hypertension   [facility] [name], MD   11 months ago Bronchitis   [facility] [name], MD   1 year ago Essential hypertension   [facility] [name], MD   2 years ago Essential hypertension   [facility] [name], MD   2 years ago Sciatica of left side   [facility] [name], MD

## 2023-07-05 ENCOUNTER — Other Ambulatory Visit: Payer: Self-pay | Admitting: Family Medicine

## 2023-07-05 ENCOUNTER — Ambulatory Visit: Payer: Self-pay

## 2023-07-05 DIAGNOSIS — F419 Anxiety disorder, unspecified: Secondary | ICD-10-CM

## 2023-07-05 DIAGNOSIS — I10 Essential (primary) hypertension: Secondary | ICD-10-CM

## 2023-07-05 NOTE — Telephone Encounter (Signed)
 FYI Only or Action Required?: FYI only for provider.  Patient was last seen in primary care on 08/20/2022 by Gasper Nancyann BRAVO, MD. Called Nurse Triage reporting Rash. Symptoms began several weeks ago. Interventions attempted: Nothing. Symptoms are: unchanged.  Triage Disposition: Home Care  Patient/caregiver understands and will follow disposition?: Yes    Copied from CRM [id number]. Topic: Clinical - Medication Question >> Jul 05, 2023  3:29 PM Tammy Holt wrote: Reason for CRM:  Tammy's air is out in her home and she is getting heat rash under breast and belly. She wants to know what she can put on it to help. Please call her [phone] Reason for Disposition  Heat rash, questions about  Answer Assessment - Initial Assessment Questions 1. APPEARANCE of RASH: Describe the rash. (e.g., spots, blisters, raised areas, skin peeling, scaly)     Under breasts and belly 2. SIZE: How big are the spots? (e.g., tip of pen, eraser, coin; inches, centimeters)     Large areas, bright red 3. LOCATION: Where is the rash located?     Under breasts and belly 4. COLOR: What color is the rash? (Note: It is difficult to assess rash color in people with darker-colored skin. When this situation occurs, simply ask the caller to describe what they see.)     Bright red 5. ONSET: When did the rash begin?     Since it's been hot, air conditioner out 6. FEVER: Do you have a fever? If Yes, ask: What is your temperature, how was it measured, and when did it start?     no 7. ITCHING: Does the rash itch? If Yes, ask: How bad is the itch? (Scale 1-10; or mild, moderate, severe)     no 8. CAUSE: What do you think is causing the rash?     Heat reash 9. MEDICINE FACTORS: Have you started any new medicines within the last 2 weeks? (e.g., antibiotics)      denies 10. OTHER SYMPTOMS: Do you have any other symptoms? (e.g., dizziness, headache, sore throat, joint pain)       denies 11. PREGNANCY:  Is there any chance you are pregnant? When was your last menstrual period?       na  Protocols used: Rash or Redness - [facility]

## 2023-07-05 NOTE — Telephone Encounter (Unsigned)
 Copied from CRM [id number]. Topic: Clinical - Medication Refill >> Jul 05, 2023  3:25 PM Montie POUR wrote: Medication: ALPRAZolam  (XANAX ) 0.5 MG tablet  Has the patient contacted their pharmacy? Yes (Agent: If no, request that the patient contact the pharmacy for the refill. If patient does not wish to contact the pharmacy document the reason why and proceed with request.) (Agent: If yes, when and what did the pharmacy advise?) Pharmacy needs order to refill  This is the patient's preferred pharmacy:  [facility] [address] [place] - [address] [place] [address] Phone: [phone] Fax: [phone]  Is this the correct pharmacy for this prescription? Yes If no, delete pharmacy and type the correct one.   Has the prescription been filled recently? No  Is the patient out of the medication? No - 2 pills left  Has the patient been seen for an appointment in the last year OR does the patient have an upcoming appointment? Yes  Can we respond through MyChart? No  Agent: Please be advised that Rx refills may take up to 3 business days. We ask that you follow-up with your pharmacy.

## 2023-07-06 NOTE — Telephone Encounter (Signed)
 Pt advised. Verbalized understanding. Reports she is not aware of her work schedule for next week so would like ot call back Friday if not better to be scheduled for an appointment

## 2023-07-06 NOTE — Telephone Encounter (Signed)
 Pt can be advised to ensure adequate hydration given heat and AC out to decrease risk of dehydration.  RASH:Minimally pt can keep cleanly with cool showers and keep area dry and limit moisture buildup. Wear supportive bras and breathable clothing (cotton). Use unscented soaps and detergents to limit irritation. She can try over the counter hydrocortisone cream to affected area to help with inflammation.  I would recommend scheduling a visit to be assessed in person for best advice and treatment.

## 2023-07-07 NOTE — Telephone Encounter (Signed)
 Requested medication (s) are due for refill today: yes  Requested medication (s) are on the active medication list: yes  Last refill:  11/25/21  Future visit scheduled: no  Notes to clinic:  Unable to refill per protocol, cannot delegate.      Requested Prescriptions  Pending Prescriptions Disp Refills   ALPRAZolam  (XANAX ) 0.5 MG tablet 30 tablet 1    Sig: Take 1 tablet (0.5 mg total) by mouth 2 (two) times daily as needed. for anxiety     Not Delegated - Psychiatry: Anxiolytics/Hypnotics 2 Failed - 07/07/2023 11:31 AM      Failed - This refill cannot be delegated      Failed - Urine Drug Screen completed in last 360 days      Failed - Valid encounter within last 6 months    Recent Outpatient Visits   None            Passed - Patient is not pregnant

## 2023-07-15 ENCOUNTER — Other Ambulatory Visit: Payer: Self-pay | Admitting: Family Medicine

## 2023-07-15 DIAGNOSIS — I10 Essential (primary) hypertension: Secondary | ICD-10-CM

## 2023-07-29 ENCOUNTER — Other Ambulatory Visit: Payer: Self-pay | Admitting: Family Medicine

## 2023-07-29 DIAGNOSIS — J45909 Unspecified asthma, uncomplicated: Secondary | ICD-10-CM

## 2023-07-29 NOTE — Telephone Encounter (Signed)
 Copied from CRM [id number]. Topic: Clinical - Medication Refill >> Jul 29, 2023  2:45 PM Winona R wrote: Medication:  albuterol  (VENTOLIN  HFA) 108 (90 Base) MCG/ACT inhaler  Has the patient contacted their pharmacy? No, she couldn't remember the name of the medication  This is the patient's preferred pharmacy:  [facility] [address], [place] - [address] [place] [address] Phone: [phone] Fax: [phone]  Is this the correct pharmacy for this prescription? Yes If no, delete pharmacy and type the correct one.   Has the prescription been filled recently? No  Is the patient out of the medication? Yes  Has the patient been seen for an appointment in the last year OR does the patient have an upcoming appointment? Yes but coming up to a year   Can we respond through MyChart? No  Agent: Please be advised that Rx refills may take up to 3 business days. We ask that you follow-up with your pharmacy.

## 2023-08-01 NOTE — Telephone Encounter (Signed)
 Requested medication (s) are due for refill today: yes  Requested medication (s) are on the active medication list: yes  Last refill:  11/08/22  Future visit scheduled: no  Notes to clinic:  Unable to refill per protocol, courtesy refill already given, routing for provider approval.      Requested Prescriptions  Pending Prescriptions Disp Refills   albuterol  (VENTOLIN  HFA) 108 (90 Base) MCG/ACT inhaler 9 g 0     Pulmonology:  Beta Agonists 2 Failed - 08/01/2023  2:57 PM      Failed - Last BP in normal range    BP Readings from Last 1 Encounters:  11/26/22 (!) 163/77         Failed - Valid encounter within last 12 months    Recent Outpatient Visits   None            Passed - Last Heart Rate in normal range    Pulse Readings from Last 1 Encounters:  11/26/22 70

## 2023-08-02 MED ORDER — ALBUTEROL SULFATE HFA 108 (90 BASE) MCG/ACT IN AERS
INHALATION_SPRAY | RESPIRATORY_TRACT | 2 refills | Status: AC
Start: 2023-08-02 — End: ?

## 2023-10-17 ENCOUNTER — Ambulatory Visit: Admitting: Family Medicine

## 2023-11-02 ENCOUNTER — Other Ambulatory Visit: Payer: Self-pay | Admitting: Family Medicine

## 2023-11-02 DIAGNOSIS — I10 Essential (primary) hypertension: Secondary | ICD-10-CM

## 2023-11-02 DIAGNOSIS — F419 Anxiety disorder, unspecified: Secondary | ICD-10-CM

## 2023-11-16 ENCOUNTER — Ambulatory Visit: Admitting: Family Medicine

## 2023-11-30 ENCOUNTER — Ambulatory Visit: Admitting: Family Medicine

## 2023-12-26 ENCOUNTER — Other Ambulatory Visit: Payer: Self-pay | Admitting: Family Medicine

## 2023-12-26 DIAGNOSIS — F419 Anxiety disorder, unspecified: Secondary | ICD-10-CM

## 2023-12-26 DIAGNOSIS — I10 Essential (primary) hypertension: Secondary | ICD-10-CM

## 2023-12-26 NOTE — Telephone Encounter (Unsigned)
 Copied from CRM #[id number]. Topic: Clinical - Medication Refill >> Dec 26, 2023  1:02 PM Mercedes MATSU wrote: Medication:  amLODipine  (NORVASC ) 2.5 MG tablet  venlafaxine  XR (EFFEXOR -XR) 75 MG 24 hr capsule  Has the patient contacted their pharmacy? Yes (Agent: If no, request that the patient contact the pharmacy for the refill. If patient does not wish to contact the pharmacy document the reason why and proceed with request.) (Agent: If yes, when and what did the pharmacy advise?)  This is the patient's preferred pharmacy:  [facility] [address] [place] - [address] [place] [address] Phone: [phone] Fax: [phone]  Is this the correct pharmacy for this prescription? Yes If no, delete pharmacy and type the correct one.   Has the prescription been filled recently? Yes  Is the patient out of the medication? Yes  Has the patient been seen for an appointment in the last year OR does the patient have an upcoming appointment? Yes  Can we respond through MyChart? Yes  Agent: Please be advised that Rx refills may take up to 3 business days. We ask that you follow-up with your pharmacy.

## 2023-12-29 NOTE — Telephone Encounter (Signed)
 Requested medication (s) are due for refill today: yes  Requested medication (s) are on the active medication list: yes  Last refill:  11/02/23  Future visit scheduled: no  Notes to clinic:  Unable to refill per protocol, courtesy refill already given, routing for provider approval.      Requested Prescriptions  Pending Prescriptions Disp Refills   amLODipine  (NORVASC ) 2.5 MG tablet 30 tablet 0    Sig: Take 1 tablet (2.5 mg total) by mouth daily.     Cardiovascular: Calcium Channel Blockers 2 Failed - 12/29/2023  9:09 AM      Failed - Last BP in normal range    BP Readings from Last 1 Encounters:  11/26/22 (!) 163/77         Failed - Valid encounter within last 6 months    Recent Outpatient Visits   None            Passed - Last Heart Rate in normal range    Pulse Readings from Last 1 Encounters:  11/26/22 70          venlafaxine  XR (EFFEXOR -XR) 75 MG 24 hr capsule 30 capsule 0    Sig: Take 1 capsule (75 mg total) by mouth daily.     Psychiatry: Antidepressants - SNRI - desvenlafaxine & venlafaxine  Failed - 12/29/2023  9:09 AM      Failed - Cr in normal range and within 360 days    Creatinine, Ser  Date Value Ref Range Status  08/20/2022 0.78 0.57 - 1.00 mg/dL Final         Failed - Completed PHQ-2 or PHQ-9 in the last 360 days      Failed - Last BP in normal range    BP Readings from Last 1 Encounters:  11/26/22 (!) 163/77         Failed - Valid encounter within last 6 months    Recent Outpatient Visits   None            Failed - Lipid Panel in normal range within the last 12 months    Cholesterol, Total  Date Value Ref Range Status  11/21/2020 192 100 - 199 mg/dL Final   LDL Chol Calc (NIH)  Date Value Ref Range Status  11/21/2020 128 (H) 0 - 99 mg/dL Final   HDL  Date Value Ref Range Status  11/21/2020 44 >39 mg/dL Final   Triglycerides  Date Value Ref Range Status  11/21/2020 109 0 - 149 mg/dL Final

## 2024-01-02 ENCOUNTER — Other Ambulatory Visit: Payer: Self-pay | Admitting: Family Medicine

## 2024-01-02 DIAGNOSIS — F419 Anxiety disorder, unspecified: Secondary | ICD-10-CM

## 2024-01-02 DIAGNOSIS — I10 Essential (primary) hypertension: Secondary | ICD-10-CM

## 2024-01-27 ENCOUNTER — Ambulatory Visit: Payer: Medicare (Managed Care) | Admitting: Family Medicine

## 2024-01-27 DIAGNOSIS — R7303 Prediabetes: Secondary | ICD-10-CM

## 2024-01-27 DIAGNOSIS — I10 Essential (primary) hypertension: Secondary | ICD-10-CM

## 2024-01-27 DIAGNOSIS — F32A Depression, unspecified: Secondary | ICD-10-CM

## 2024-01-27 DIAGNOSIS — Z23 Encounter for immunization: Secondary | ICD-10-CM

## 2024-01-27 DIAGNOSIS — J45909 Unspecified asthma, uncomplicated: Secondary | ICD-10-CM

## 2024-01-27 DIAGNOSIS — E7800 Pure hypercholesterolemia, unspecified: Secondary | ICD-10-CM

## 2024-01-27 DIAGNOSIS — Z1211 Encounter for screening for malignant neoplasm of colon: Secondary | ICD-10-CM

## 2024-01-27 DIAGNOSIS — Z1231 Encounter for screening mammogram for malignant neoplasm of breast: Secondary | ICD-10-CM

## 2024-02-08 ENCOUNTER — Ambulatory Visit: Payer: Medicare (Managed Care) | Admitting: Family Medicine

## 2024-02-17 ENCOUNTER — Ambulatory Visit: Payer: Medicare (Managed Care) | Admitting: Family Medicine

## 2024-02-17 DIAGNOSIS — Z23 Encounter for immunization: Secondary | ICD-10-CM

## 2024-02-17 DIAGNOSIS — E7800 Pure hypercholesterolemia, unspecified: Secondary | ICD-10-CM

## 2024-02-17 DIAGNOSIS — Z1211 Encounter for screening for malignant neoplasm of colon: Secondary | ICD-10-CM

## 2024-02-17 DIAGNOSIS — F17210 Nicotine dependence, cigarettes, uncomplicated: Secondary | ICD-10-CM

## 2024-02-17 DIAGNOSIS — Z8541 Personal history of malignant neoplasm of cervix uteri: Secondary | ICD-10-CM

## 2024-02-17 DIAGNOSIS — F32A Depression, unspecified: Secondary | ICD-10-CM

## 2024-02-17 DIAGNOSIS — Z1231 Encounter for screening mammogram for malignant neoplasm of breast: Secondary | ICD-10-CM

## 2024-02-17 DIAGNOSIS — F419 Anxiety disorder, unspecified: Secondary | ICD-10-CM

## 2024-02-17 DIAGNOSIS — I10 Essential (primary) hypertension: Secondary | ICD-10-CM

## 2024-02-17 DIAGNOSIS — R7303 Prediabetes: Secondary | ICD-10-CM

## 2024-02-17 DIAGNOSIS — Z8711 Personal history of peptic ulcer disease: Secondary | ICD-10-CM

## 2024-02-17 DIAGNOSIS — N951 Menopausal and female climacteric states: Secondary | ICD-10-CM

## 2024-02-17 DIAGNOSIS — K219 Gastro-esophageal reflux disease without esophagitis: Secondary | ICD-10-CM

## 2024-02-29 ENCOUNTER — Ambulatory Visit: Payer: Medicare (Managed Care) | Admitting: Family Medicine
